# Patient Record
Sex: Female | Born: 1945 | Race: White | Hispanic: No | Marital: Single | Smoking: Former smoker
Health system: Southern US, Community
[De-identification: ages and names within clinical notes are randomized; demographics above are authoritative.]

## PROBLEM LIST (undated history)

## (undated) DIAGNOSIS — R112 Nausea with vomiting, unspecified: Secondary | ICD-10-CM

## (undated) DIAGNOSIS — Z9889 Other specified postprocedural states: Secondary | ICD-10-CM

## (undated) DIAGNOSIS — J449 Chronic obstructive pulmonary disease, unspecified: Secondary | ICD-10-CM

## (undated) HISTORY — PX: CHOLECYSTECTOMY: SHX55

## (undated) HISTORY — PX: OTHER SURGICAL HISTORY: SHX169

---

## 2012-05-03 ENCOUNTER — Encounter (HOSPITAL_COMMUNITY): Payer: Self-pay | Admitting: Emergency Medicine

## 2012-05-03 ENCOUNTER — Emergency Department (HOSPITAL_COMMUNITY)
Admission: EM | Admit: 2012-05-03 | Discharge: 2012-05-03 | Disposition: A | Discharge status: Home / Self Care | Payer: Medicare Other | Attending: Internal Medicine | Admitting: Internal Medicine

## 2012-05-03 ENCOUNTER — Emergency Department (HOSPITAL_COMMUNITY): Payer: Medicare Other

## 2012-05-03 DIAGNOSIS — L02519 Cutaneous abscess of unspecified hand: Secondary | ICD-10-CM | POA: Diagnosis not present

## 2012-05-03 DIAGNOSIS — J441 Chronic obstructive pulmonary disease with (acute) exacerbation: Secondary | ICD-10-CM | POA: Diagnosis not present

## 2012-05-03 DIAGNOSIS — L03119 Cellulitis of unspecified part of limb: Secondary | ICD-10-CM | POA: Diagnosis not present

## 2012-05-03 DIAGNOSIS — L03113 Cellulitis of right upper limb: Secondary | ICD-10-CM

## 2012-05-03 DIAGNOSIS — L089 Local infection of the skin and subcutaneous tissue, unspecified: Secondary | ICD-10-CM

## 2012-05-03 DIAGNOSIS — M7989 Other specified soft tissue disorders: Secondary | ICD-10-CM | POA: Diagnosis not present

## 2012-05-03 DIAGNOSIS — J45909 Unspecified asthma, uncomplicated: Secondary | ICD-10-CM

## 2012-05-03 LAB — CBC
HCT: 38.7 % (ref 36.0–46.0)
Hemoglobin: 13 g/dL (ref 12.0–15.0)
MCH: 30 pg (ref 26.0–34.0)
MCV: 89.2 fL (ref 78.0–100.0)
RBC: 4.34 MIL/uL (ref 3.87–5.11)

## 2012-05-03 LAB — BASIC METABOLIC PANEL
BUN: 11 mg/dL (ref 6–23)
CO2: 25 mEq/L (ref 19–32)
Chloride: 102 mEq/L (ref 96–112)
Glucose, Bld: 89 mg/dL (ref 70–99)
Potassium: 3.7 mEq/L (ref 3.5–5.1)
Sodium: 136 mEq/L (ref 135–145)

## 2012-05-03 MED ORDER — PREDNISONE 20 MG PO TABS
60.0000 mg | ORAL_TABLET | Freq: Once | ORAL | Status: AC
  Administered 2012-05-03: 60 mg via ORAL
  Filled 2012-05-03: qty 3

## 2012-05-03 MED ORDER — ALBUTEROL SULFATE HFA 108 (90 BASE) MCG/ACT IN AERS
2.0000 | INHALATION_SPRAY | RESPIRATORY_TRACT | Status: DC | PRN
  Administered 2012-05-03: 2 via RESPIRATORY_TRACT
  Filled 2012-05-03: qty 6.7

## 2012-05-03 MED ORDER — SODIUM CHLORIDE 0.9 % IN NEBU
INHALATION_SOLUTION | RESPIRATORY_TRACT | Status: AC
  Administered 2012-05-03: 15:00:00
  Filled 2012-05-03: qty 3

## 2012-05-03 MED ORDER — ALBUTEROL SULFATE (5 MG/ML) 0.5% IN NEBU
5.0000 mg | INHALATION_SOLUTION | Freq: Once | RESPIRATORY_TRACT | Status: AC
  Administered 2012-05-03: 5 mg via RESPIRATORY_TRACT
  Filled 2012-05-03: qty 1

## 2012-05-03 MED ORDER — IPRATROPIUM BROMIDE 0.02 % IN SOLN
0.5000 mg | Freq: Once | RESPIRATORY_TRACT | Status: AC
  Administered 2012-05-03: 0.5 mg via RESPIRATORY_TRACT
  Filled 2012-05-03: qty 2.5

## 2012-05-03 MED ORDER — MOXIFLOXACIN HCL IN NACL 400 MG/250ML IV SOLN
400.0000 mg | Freq: Once | INTRAVENOUS | Status: AC
  Administered 2012-05-03: 400 mg via INTRAVENOUS
  Filled 2012-05-03: qty 250

## 2012-05-03 MED ORDER — VANCOMYCIN HCL IN DEXTROSE 1-5 GM/200ML-% IV SOLN
1000.0000 mg | Freq: Once | INTRAVENOUS | Status: AC
  Administered 2012-05-03: 1000 mg via INTRAVENOUS
  Filled 2012-05-03: qty 200

## 2012-05-03 NOTE — ED Provider Notes (Signed)
History   This chart was scribed for Laray Anger, DO by Clarita Crane. The patient was seen in room APA12/APA12. Patient's care was started at 1422.    CSN: [id number]  Arrival date & time 05/03/12  1422   First MD Initiated Contact with Patient 05/03/12 1446      Chief Complaint  Patient presents with  . Shortness of Breath     HPI Pt was seen at 2:53 PM Tamara Michael is a 66 y.o. female seen at 2:53 PM who presents to the Emergency Department complaining of gradual onset and worsening of persistent SOB for the past 2 days.  Has been associated with cough.  Describes her symptoms as similar to her previous exacerbations of asthma.  Has recently run out of her inhaler and does not have a PMD for a refill.  Pt also c/o gradual onset and worsening of persistent redness and swelling to her right dorsal hand that began after she abraded her hand several days ago.  Denies drainage from wound, no fevers, no CP/palpitations, no abd pain, no N/V/D.   Last Tetanus approx 2 years ago Past Medical History  Diagnosis Date  . Asthma     Past Surgical History  Procedure Date  . Cholecystectomy   . Arm surgery     History  Substance Use Topics  . Smoking status: Former Games developer  . Smokeless tobacco: Not on file  . Alcohol Use: No    Review of Systems ROS: Statement: All systems negative except as marked or noted in the HPI; Constitutional: Negative for fever and chills. ; ; Eyes: Negative for eye pain, redness and discharge. ; ; ENMT: Negative for ear pain, hoarseness, nasal congestion, sinus pressure and sore throat. ; ; Cardiovascular: Negative for chest pain, palpitations, diaphoresis, and peripheral edema. ; ; Respiratory: +cough, SOB. Negative for wheezing and stridor. ; ; Gastrointestinal: Negative for nausea, vomiting, diarrhea, abdominal pain, blood in stool, hematemesis, jaundice and rectal bleeding. . ; ; Genitourinary: Negative for dysuria, flank pain and hematuria. ; ;  Musculoskeletal: Negative for back pain and neck pain. Negative for swelling and trauma.; ; Skin: +abrasion, rash, swelling. Negative for pruritus, blisters, bruising and skin lesion.; ; Neuro: Negative for headache, lightheadedness and neck stiffness. Negative for weakness, altered level of consciousness , altered mental status, extremity weakness, paresthesias, involuntary movement, seizure and syncope.     Allergies  Other  Home Medications  No current outpatient prescriptions on file.  BP 123/75  Pulse 114  Temp(Src) 97.5 F (36.4 C) (Oral)  Resp 24  Ht 5\' 3"  (1.6 m)  Wt 110 lb (49.896 kg)  BMI 19.49 kg/m2  SpO2 98%  Physical Exam Exam performed at 2:55PM: Physical examination:  Nursing notes reviewed; Vital signs and O2 SAT reviewed;  Constitutional: Well developed, Well nourished, Well hydrated, In no acute distress; Head:  Normocephalic, atraumatic; Eyes: EOMI, PERRL, No scleral icterus; ENMT: Mouth and pharynx normal, Mucous membranes moist; Neck: Supple, Full range of motion, No lymphadenopathy; Cardiovascular: Regular rate and rhythm, No gallop; Respiratory: Breath sounds diminished & equal bilaterally, speaking in long phrases, tachypneic, access mm use,; Chest: Nontender, Movement normal; Abdomen: Soft, Nontender, Nondistended, Normal bowel sounds; Extremities: Pulses normal, No deformity, No calf edema or asymmetry.; Neuro: AA&Ox3, Major CN grossly intact.  No gross focal motor or sensory deficits in extremities.; Skin: Color normal, Warm, Dry; +approx 1cm flap skin tear left palmar thenar eminence without drainage, erythema, or ecchymosis; Left and right wrists, hands and fingers  NT to palp, no deformities; +right dorsal hand at 3rd to 5th MCP and distal metacarpal areas with approx 4x5cm area of tenderness, erythema, induration, edema with <1cm abrasion with scab at 4th MCP area, small amount of serous drainage expressed.      ED Course  Procedures   MDM  MDM Reviewed:  nursing note and vitals Interpretation: labs and x-ray     Results for orders placed during the hospital encounter of 05/03/12  BASIC METABOLIC PANEL      Component Value Range   Sodium 136  135 - 145 (mEq/L)   Potassium 3.7  3.5 - 5.1 (mEq/L)   Chloride 102  96 - 112 (mEq/L)   CO2 25  19 - 32 (mEq/L)   Glucose, Bld 89  70 - 99 (mg/dL)   BUN 11  6 - 23 (mg/dL)   Creatinine, Ser 3.08  0.50 - 1.10 (mg/dL)   Calcium 9.6  8.4 - 65.7 (mg/dL)   GFR calc non Af Amer 73 (*) >90 (mL/min)   GFR calc Af Amer 85 (*) >90 (mL/min)  CBC      Component Value Range   WBC 4.7  4.0 - 10.5 (K/uL)   RBC 4.34  3.87 - 5.11 (MIL/uL)   Hemoglobin 13.0  12.0 - 15.0 (g/dL)   HCT 84.6  96.2 - 95.2 (%)   MCV 89.2  78.0 - 100.0 (fL)   MCH 30.0  26.0 - 34.0 (pg)   MCHC 33.6  30.0 - 36.0 (g/dL)   RDW 84.1  32.4 - 40.1 (%)   Platelets 263  150 - 400 (K/uL)   Dg Chest 2 View 05/03/2012  *RADIOLOGY REPORT*  Clinical Data: Cough.  History of smoking.  CHEST - 2 VIEW  Comparison: No priors.  Findings: Lungs appear hyperexpanded with flattening of the hemidiaphragms, increased retrosternal air space and pruning of the pulmonary vasculature in the periphery, suggestive of underlying COPD.  No consolidative airspace disease.  No pleural effusions. No evidence of edema.  Cardiomediastinal silhouette is within normal limits.  Atherosclerotic calcification within the arch of the aorta.  Lateral projection demonstrates a compression fracture at the thoracolumbar junction (likely L1) with approximately 30% loss of anterior vertebral body height.  IMPRESSION: 1.  No radiographic evidence of acute cardiopulmonary disease. 2.  Changes compatible with COPD, as above. 3.  Atherosclerosis. 4.  Compression fracture near the thoracolumbar junction (likely L1) with approximately 30% loss of anterior vertebral body height.  Original Report Authenticated By: Florencia Reasons, M.D.   Dg Hand Complete Left 05/03/2012  *RADIOLOGY REPORT*   Clinical Data: Pain at index finger.  LEFT HAND - COMPLETE 3+ VIEW  Comparison: None.  Findings: No evidence for fracture.  There is no subluxation or dislocation.  Degenerative changes are seen at the DIP joint of the index finger.  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  IMPRESSION: Degenerative changes as described.  No acute bony findings.  Original Report Authenticated By: ERIC A. MANSELL, M.D.   Dg Hand Complete Right 05/03/2012  *RADIOLOGY REPORT*  Clinical Data: Pain and swelling.  RIGHT HAND - COMPLETE 3+ VIEW  Comparison: No priors.  Findings: Three views of the right hand demonstrate no acute fracture, subluxation or dislocation.  There are advanced degenerative changes of osteoarthritis in the first [facility] joint.  Old post-traumatic deformity from remote distal radial fracture is incidentally noted.  IMPRESSION: 1.  No acute radiographic abnormality of the right hand. 2.  Old  post-traumatic deformity of the distal right radius. 3.  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  Original Report Authenticated By: Florencia Reasons, M.D.      Cali.Holter:  IV vancomycin started for right hand cellulitis. Does not appear to be abscess at this time. No pneumonia on CXR.  No fx on hand XR.  Neb and prednisone given, lungs continue diminished bilat, no wheezes, Sats now 96% R/A; pt is still sitting upright, tachypneic with dropping SBP to 90's.  Refuses another neb at this time.  Pt has no PMD to assure f/u.  Dx testing d/w pt and family.  Questions answered.  Verb understanding, agreeable to observation admit for nebs for COPD exacerbation and IV abx for hand cellulitis.  T/C to [facility] Dr. Sherrie Mustache, case discussed, including:  HPI, pertinent PM/SHx, VS/PE, dx testing, ED course and treatment:  Agreeable to observation admit, requests to write temporary orders, obtain tele bed to team 1 and start IV avelox to cover for COPD  exacerbation.       I personally performed the services described in this documentation, which was scribed in my presence. The recorded information has been reviewed and considered. Laray Corbit,Zamyra TOMECA HELM, DO 05/05/12 1656

## 2012-05-03 NOTE — Discharge Instructions (Signed)
 RESOURCE GUIDE  Dental Problems  Patients with Medicaid: [facility]                     [facility] [address]                                           [address] Phone:  [phone]                                                  Phone:  [phone]  If unable to pay or uninsured, contact:  [facility] or [facility] to become qualified for the adult dental clinic.  Chronic Pain Problems Contact [facility]  [phone] Patients need to be referred by their primary care doctor.  Insufficient Money for Medicine Contact [employer]:  call "211" or [facility] [phone].  No Primary Care Doctor Call [facility]  [phone] Other agencies that provide inexpensive medical care    [facility]  [phone]     Internal Medicine  [phone]    [facility]  [phone]    Women's Clinic  [phone]    [facility]  [phone]    [facility]  [phone]  Psychological Services [facility]  [phone] [facility]  [phone] [facility]   [phone] (emergency services [phone])  Substance Abuse Resources [facility]  336-882-2125 [facility] 336-784-9470 [facility] 336-285-9073 [facility] 336-845-3988 Residential & Outpatient Substance Abuse Program  800-659-3381  Abuse/Neglect [place] Child Abuse Hotline (336) 641-3795 [place] Child Abuse Hotline 800-378-5315 (After Hours)  Emergency Shelter [facility] (336) 271-5985  M[facility] (336) 275-9566 [facility] (704) [phone]  MRSA Hotline #:   832-7006    [place] [facility]  [facility] [place]     [employer]                          [facility] [address] [place]                       [address]      [address]  [place]                                                [place]                            [place] Phone:  [phone]                                   Phone:  [phone]                 Phone:  [phone]  [facility] Phone:    [phone]  [place] Child Abuse Hotline (336) 342-1394 (336) 342-3537 (After Hours)   

## 2012-05-03 NOTE — ED Notes (Signed)
Pt is sob x 45 minutes and is out of her asthma meds. Pt has abrasion to the rt hand.

## 2012-05-03 NOTE — ED Notes (Signed)
Pt says doesn't want an IV if she can help it.  Notified Dr. Clarene Duke.

## 2012-05-03 NOTE — Consult Note (Addendum)
Emergency room consult  Date of consult 05/03/2012   PCP:   No primary provider on file.   Physician requesting consult: Samuel Jester D.O.  Consulting physician: Vania Rea M.D.   Reason for consult: Cellulitis of the right hand, with acute exacerbation of asthma requiring steroids    Impression Acute exacerbation of asthma much improved Nontender , 2 x 2 cm Superficial cellulitis dorsum of hand, over right fourth metacarpal Absence of fever, absence of leukocytosis  No indication for inpatient management   Recommendations: Discharge home with oral antibiotics for cellulitis Discharge home with bronchodilators Reinforce the importance of establishing primary care and offer assistance as needed     HPI: Tamara Michael is an 66 y.o. female.   He is a middle-aged Caucasian lady with a history of asthma who apparently relocated to this area about 2 years ago and has not yet established primary care.  Patient presented to the emergency room complaining of shortness of breath for the past 45 minutes, and the knot out of her bronchodilators. She also complained of a abrasion of the right hand. He should receive serial nebulizations in the emergency room, and oral steroids. Her wheezing apparently did not resolve completely prior to the hospitalist being called. He also received a dose of vancomycin for cellulitis.  She gives no history of fever or chills; she has no productive cough; she is anxious to be discharged home.  Emergency room physician was concerned that giving steroids may aggravate her cellulitis and call the hospitalist service to assist with management.  Rewiew of Systems:  The patient denies anorexia, fever, weight loss,, vision loss, decreased hearing, hoarseness, chest pain, syncope, dyspnea on exertion, peripheral edema, balance deficits, hemoptysis, abdominal pain, melena, hematochezia, severe indigestion/heartburn, hematuria, incontinence,  genital sores, muscle weakness, suspicious skin lesions, transient blindness, difficulty walking, depression, unusual weight change, abnormal bleeding, enlarged lymph nodes, angioedema, and breast masses.   Past Medical History  Diagnosis Date  . Asthma     Past Surgical History  Procedure Date  . Cholecystectomy   . Arm surgery     Medications:  HOME MEDS: Prior to Admission medications   Medication Sig Start Date End Date Taking? Authorizing Provider  albuterol (PROVENTIL HFA;VENTOLIN HFA) 108 (90 BASE) MCG/ACT inhaler Inhale 2 puffs into the lungs every 6 (six) hours as needed.   Yes Historical Provider, MD     Allergies:  Allergies  Allergen Reactions  . Other     anesthesia    Social History:   reports that she has quit smoking. She does not have any smokeless tobacco history on file. She reports that she does not drink alcohol or use illicit drugs.  Family History: History reviewed. No pertinent family history.   Physical Exam: Filed Vitals:   05/03/12 1425 05/03/12 1455  BP: 123/75   Pulse: 114   Temp: 97.5 F (36.4 C)   TempSrc: Oral   Resp: 24   Height: 5\' 3"  (1.6 m)   Weight: 49.896 kg (110 lb)   SpO2: 98% 99%   Blood pressure 123/75, pulse 114, temperature 97.5 F (36.4 C), temperature source Oral, resp. rate 24, height 5\' 3"  (1.6 m), weight 49.896 kg (110 lb), SpO2 99.00%.  GEN:  Pleasant middle-aged lady sitting up in the stretcher in no acute distress; cooperative with exam PSYCH:  alert and oriented x4; does not appear anxious or depressed; affect is appropriate. HEENT: Mucous membranes pink and anicteric; PERRLA; EOM intact; no cervical lymphadenopathy nor thyromegaly or carotid  bruit; no JVD; Breasts:: Not examined CHEST WALL: No tenderness CHEST: Normal respiration, clear to auscultation bilaterally; no wheezing, no rhonchi HEART: Regular rate and rhythm; no murmurs rubs or gallops BACK: Mild kyphosis no scoliosis; no CVA  tenderness ABDOMEN: Scaphoid, soft non-tender; no masses, no organomegaly, normal abdominal bowel sounds; no pannus; no intertriginous candida. Rectal Exam: Not done EXTREMITIES:  age-appropriate arthropathy of the hands and knees; no edema; reddened area 2 x 2 cm, dorsum of right over the fourth metacarpal; nontender, branches easily to touch, is a central area of scabbing suggesting a healing abrasion.Renetta Chalk: not examined PULSES: 2+ and symmetric SKIN: Normal hydration no rash or ulceration other than noted above CNS: Cranial nerves 2-12 grossly intact no focal lateralizing neurologic deficit   Labs & Imaging Results for orders placed during the hospital encounter of 05/03/12 (from the past 48 hour(s))  BASIC METABOLIC PANEL     Status: Abnormal   Collection Time   05/03/12  3:02 PM      Component Value Range Comment   Sodium 136  135 - 145 (mEq/L)    Potassium 3.7  3.5 - 5.1 (mEq/L)    Chloride 102  96 - 112 (mEq/L)    CO2 25  19 - 32 (mEq/L)    Glucose, Bld 89  70 - 99 (mg/dL)    BUN 11  6 - 23 (mg/dL)    Creatinine, Ser 9.60  0.50 - 1.10 (mg/dL)    Calcium 9.6  8.4 - 10.5 (mg/dL)    GFR calc non Af Amer 73 (*) >90 (mL/min)    GFR calc Af Amer 85 (*) >90 (mL/min)   CBC     Status: Normal   Collection Time   05/03/12  3:02 PM      Component Value Range Comment   WBC 4.7  4.0 - 10.5 (K/uL)    RBC 4.34  3.87 - 5.11 (MIL/uL)    Hemoglobin 13.0  12.0 - 15.0 (g/dL)    HCT 45.4  09.8 - 11.9 (%)    MCV 89.2  78.0 - 100.0 (fL)    MCH 30.0  26.0 - 34.0 (pg)    MCHC 33.6  30.0 - 36.0 (g/dL)    RDW 14.7  82.9 - 56.2 (%)    Platelets 263  150 - 400 (K/uL)    Dg Chest 2 View  05/03/2012  *RADIOLOGY REPORT*  Clinical Data: Cough.  History of smoking.  CHEST - 2 VIEW  Comparison: No priors.  Findings: Lungs appear hyperexpanded with flattening of the hemidiaphragms, increased retrosternal air space and pruning of the pulmonary vasculature in the periphery, suggestive of underlying  COPD.  No consolidative airspace disease.  No pleural effusions. No evidence of edema.  Cardiomediastinal silhouette is within normal limits.  Atherosclerotic calcification within the arch of the aorta.  Lateral projection demonstrates a compression fracture at the thoracolumbar junction (likely L1) with approximately 30% loss of anterior vertebral body height.  IMPRESSION: 1.  No radiographic evidence of acute cardiopulmonary disease. 2.  Changes compatible with COPD, as above. 3.  Atherosclerosis. 4.  Compression fracture near the thoracolumbar junction (likely L1) with approximately 30% loss of anterior vertebral body height.  Original Report Authenticated By: Florencia Reasons, M.D.   Dg Hand Complete Left  05/03/2012  *RADIOLOGY REPORT*  Clinical Data: Pain at index finger.  LEFT HAND - COMPLETE 3+ VIEW  Comparison: None.  Findings: No evidence for fracture.  There is no subluxation or dislocation.  Degenerative  changes are seen at the DIP joint of the index finger.  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  IMPRESSION: Degenerative changes as described.  No acute bony findings.  Original Report Authenticated By: ERIC A. MANSELL, M.D.   Dg Hand Complete Right  05/03/2012  *RADIOLOGY REPORT*  Clinical Data: Pain and swelling.  RIGHT HAND - COMPLETE 3+ VIEW  Comparison: No priors.  Findings: Three views of the right hand demonstrate no acute fracture, subluxation or dislocation.  There are advanced degenerative changes of osteoarthritis in the first [facility] joint.  Old post-traumatic deformity from remote distal radial fracture is incidentally noted.  IMPRESSION: 1.  No acute radiographic abnormality of the right hand. 2.  Old post-traumatic deformity of the distal right radius. 3.  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  Original Report Authenticated By: Florencia Reasons, M.D.     Time spent interviewing and examining this patient,  this patient discussing and planning her care with staff : 30 minutes  Leaira Fullam 05/03/2012, 7:30 PM    Addendum  Emergency room physician felt uncomfortable discharging home the patient, and so I wrote the patient prescriptions for for 5 days of amoxicillin 500 mg times daily, which I feel is more than adequate for this superficial the healing infection, And a prescription for an albuterol inhaler. I did not prescribe steroids since patient is now completely free of any respiratory symptoms and I feel she should demonstrate more assistance to standard therapy before subjecting her to steroids.   Vania Rea MD 05/03/2012  8:34 PM

## 2012-05-03 NOTE — ED Notes (Signed)
Prescription for Albuterol inhaler 2 puffs every 4 hours as needed and Amoxil 500 mg three times a day for 5 days written by Dr. Oralia Manis.

## 2012-05-15 ENCOUNTER — Encounter (HOSPITAL_COMMUNITY): Payer: Self-pay | Admitting: Emergency Medicine

## 2012-05-15 ENCOUNTER — Emergency Department (HOSPITAL_COMMUNITY)
Admission: EM | Admit: 2012-05-15 | Discharge: 2012-05-15 | Disposition: A | Discharge status: Home / Self Care | Payer: Medicare Other | Attending: Emergency Medicine | Admitting: Emergency Medicine

## 2012-05-15 DIAGNOSIS — R0602 Shortness of breath: Secondary | ICD-10-CM | POA: Insufficient documentation

## 2012-05-15 DIAGNOSIS — J4489 Other specified chronic obstructive pulmonary disease: Secondary | ICD-10-CM | POA: Insufficient documentation

## 2012-05-15 DIAGNOSIS — J449 Chronic obstructive pulmonary disease, unspecified: Secondary | ICD-10-CM | POA: Diagnosis not present

## 2012-05-15 DIAGNOSIS — J45909 Unspecified asthma, uncomplicated: Secondary | ICD-10-CM | POA: Diagnosis not present

## 2012-05-15 DIAGNOSIS — R059 Cough, unspecified: Secondary | ICD-10-CM | POA: Diagnosis not present

## 2012-05-15 DIAGNOSIS — R05 Cough: Secondary | ICD-10-CM | POA: Insufficient documentation

## 2012-05-15 HISTORY — DX: Chronic obstructive pulmonary disease, unspecified: J44.9

## 2012-05-15 MED ORDER — ALBUTEROL SULFATE (5 MG/ML) 0.5% IN NEBU
5.0000 mg | INHALATION_SOLUTION | Freq: Once | RESPIRATORY_TRACT | Status: AC
  Administered 2012-05-15: 5 mg via RESPIRATORY_TRACT
  Filled 2012-05-15: qty 1

## 2012-05-15 MED ORDER — ALBUTEROL SULFATE HFA 108 (90 BASE) MCG/ACT IN AERS
2.0000 | INHALATION_SPRAY | RESPIRATORY_TRACT | Status: DC | PRN
  Filled 2012-05-15: qty 6.7

## 2012-05-15 MED ORDER — IPRATROPIUM BROMIDE 0.02 % IN SOLN
0.5000 mg | Freq: Once | RESPIRATORY_TRACT | Status: AC
  Administered 2012-05-15: 0.5 mg via RESPIRATORY_TRACT
  Filled 2012-05-15: qty 2.5

## 2012-05-15 MED ORDER — PREDNISONE 20 MG PO TABS
60.0000 mg | ORAL_TABLET | Freq: Once | ORAL | Status: AC
  Administered 2012-05-15: 60 mg via ORAL
  Filled 2012-05-15: qty 3

## 2012-05-15 MED ORDER — ALBUTEROL SULFATE HFA 108 (90 BASE) MCG/ACT IN AERS: 1.0000 | INHALATION_SPRAY | Freq: Four times a day (QID) | RESPIRATORY_TRACT | Status: DC | PRN

## 2012-05-15 MED ORDER — PREDNISONE 50 MG PO TABS: ORAL_TABLET | ORAL | Status: AC

## 2012-05-15 NOTE — ED Provider Notes (Signed)
History     CSN: [id number]  Arrival date & time 05/15/12  0012   First MD Initiated Contact with Patient 05/15/12 0136      Chief Complaint  Patient presents with  . Shortness of Breath     Patient is a 66 y.o. female presenting with shortness of breath. The history is provided by the patient.  Shortness of Breath  The current episode started today. The onset was gradual. The problem occurs frequently. The problem has been gradually worsening. The problem is moderate. The symptoms are relieved by beta-agonist inhalers. The symptoms are aggravated by activity. Associated symptoms include cough, shortness of breath and wheezing. Pertinent negatives include no chest pain and no fever.  pt reports long h/o asthma She reports for past day she has had cough/wheeze She reports her inhaler is not working Denies current smoking Denies h/o intubation   Past Medical History  Diagnosis Date  . Asthma   . COPD (chronic obstructive pulmonary disease)     Past Surgical History  Procedure Date  . Cholecystectomy   . Arm surgery     No family history on file.  History  Substance Use Topics  . Smoking status: Former Games developer  . Smokeless tobacco: Not on file  . Alcohol Use: No    OB History    Grav Para Term Preterm Abortions TAB SAB Ect Mult Living                  Review of Systems  Constitutional: Negative for fever.  Respiratory: Positive for cough, shortness of breath and wheezing.   Cardiovascular: Negative for chest pain.    Allergies  Other  Home Medications   Current Outpatient Rx  Name Route Sig Dispense Refill  . ALBUTEROL SULFATE HFA 108 (90 BASE) MCG/ACT IN AERS Inhalation Inhale 2 puffs into the lungs every 6 (six) hours as needed.    . ALBUTEROL SULFATE HFA 108 (90 BASE) MCG/ACT IN AERS Inhalation Inhale 1-2 puffs into the lungs every 6 (six) hours as needed for wheezing. 1 Inhaler 0  . PREDNISONE 50 MG PO TABS  One tablet PO daily 4 tablet 0    BP  113/66  Pulse 72  Temp(Src) 97.8 F (36.6 C) (Oral)  Resp 24  Ht 5\' 4"  (1.626 m)  Wt 110 lb (49.896 kg)  BMI 18.88 kg/m2  SpO2 100%  Physical Exam CONSTITUTIONAL: Well developed/well nourished HEAD AND FACE: Normocephalic/atraumatic EYES: EOMI/PERRL ENMT: Mucous membranes moist NECK: supple no meningeal signs CV: S1/S2 noted, no murmurs/rubs/gallops noted LUNGS: scattered wheezes noted bilaterally, but no apparent distress and able to speak to me clearly ABDOMEN: soft, nontender, no rebound or guarding GU:no cva tenderness NEURO: Pt is awake/alert, moves all extremitiesx4 EXTREMITIES: pulses normal, full ROM, no edema noted SKIN: warm, color normal PSYCH: no abnormalities of mood noted  ED Course  Procedures    1. Asthma    Pt improved by the time of my evaluation, she had received nebs, felt improved, wheeze improved and walked around ED in no distress Stable for d/c Imaging not ordered as afebrile here, no hypoxia, doubt CHF   The patient appears reasonably screened and/or stabilized for discharge and I doubt any other medical condition or other [facility] requiring further screening, evaluation, or treatment in the ED at this time prior to discharge.    MDM  Nursing notes reviewed and considered in documentation Previous records reviewed and considered Previous Xray reviewed       Date: 05/15/2012  Rate: 55  Rhythm: sinus bradycardia  QRS Axis: normal  Intervals: normal  ST/T Wave abnormalities: normal  Conduction Disutrbances:none    Joya Gaskins, MD 05/15/12 [phone]

## 2012-05-15 NOTE — ED Notes (Signed)
Patient c/o shortness of breath that started today.

## 2012-05-15 NOTE — ED Notes (Signed)
Discharge instructions given and reviewed with patient.  Prescriptions reviewed for Albuterol MDI and Prednisone; effects and use explained.  Patient verbalized understanding of medications and to follow up with PMD as needed.  Patient ambulatory; discharged home in good condition.

## 2012-05-15 NOTE — ED Notes (Signed)
MD at bedside. 

## 2012-05-15 NOTE — Discharge Instructions (Signed)
Asthma Attack Prevention HOW CAN ASTHMA BE PREVENTED? Currently, there is no way to prevent asthma from starting. However, you can take steps to control the disease and prevent its symptoms after you have been diagnosed. Learn about your asthma and how to control it. Take an active role to control your asthma by working with your caregiver to create and follow an asthma action plan. An asthma action plan guides you in taking your medicines properly, avoiding factors that make your asthma worse, tracking your level of asthma control, responding to worsening asthma, and seeking emergency care when needed. To track your asthma, keep records of your symptoms, check your peak flow number using a peak flow meter (handheld device that shows how well air moves out of your lungs), and get regular asthma checkups.  Other ways to prevent asthma attacks include:  Use medicines as your caregiver directs.   Identify and avoid things that make your asthma worse (as much as you can).   Keep track of your asthma symptoms and level of control.   Get regular checkups for your asthma.   With your caregiver, write a detailed plan for taking medicines and managing an asthma attack. Then be sure to follow your action plan. Asthma is an ongoing condition that needs regular monitoring and treatment.   Identify and avoid asthma triggers. A number of outdoor allergens and irritants (pollen, mold, cold air, air pollution) can trigger asthma attacks. Find out what causes or makes your asthma worse, and take steps to avoid those triggers (see below).   Monitor your breathing. Learn to recognize warning signs of an attack, such as slight coughing, wheezing or shortness of breath. However, your lung function may already decrease before you notice any signs or symptoms, so regularly measure and record your peak airflow with a home peak flow meter.   Identify and treat attacks early. If you act quickly, you're less likely to have  a severe attack. You will also need less medicine to control your symptoms. When your peak flow measurements decrease and alert you to an upcoming attack, take your medicine as instructed, and immediately stop any activity that may have triggered the attack. If your symptoms do not improve, get medical help.   Pay attention to increasing quick-relief inhaler use. If you find yourself relying on your quick-relief inhaler (such as albuterol), your asthma is not under control. See your caregiver about adjusting your treatment.  IDENTIFY AND CONTROL FACTORS THAT MAKE YOUR ASTHMA WORSE A number of common things can set off or make your asthma symptoms worse (asthma triggers). Keep track of your asthma symptoms for several weeks, detailing all the environmental and emotional factors that are linked with your asthma. When you have an asthma attack, go back to your asthma diary to see which factor, or combination of factors, might have contributed to it. Once you know what these factors are, you can take steps to control many of them.  Allergies: If you have allergies and asthma, it is important to take asthma prevention steps at home. Asthma attacks (worsening of asthma symptoms) can be triggered by allergies, which can cause temporary increased inflammation of your airways. Minimizing contact with the substance to which you are allergic will help prevent an asthma attack. Animal Dander:   Some people are allergic to the flakes of skin or dried saliva from animals with fur or feathers. Keep these pets out of your home.   If you can't keep a pet outdoors, keep the   pet out of your bedroom and other sleeping areas at all times, and keep the door closed.   Remove carpets and furniture covered with cloth from your home. If that is not possible, keep the pet away from fabric-covered furniture and carpets.  Dust Mites:  Many people with asthma are allergic to dust mites. Dust mites are tiny bugs that are found in  every home, in mattresses, pillows, carpets, fabric-covered furniture, bedcovers, clothes, stuffed toys, fabric, and other fabric-covered items.   Cover your mattress in a special dust-proof cover.   Cover your pillow in a special dust-proof cover, or wash the pillow each week in hot water. Water must be hotter than 130 F to kill dust mites. Cold or warm water used with detergent and bleach can also be effective.   Wash the sheets and blankets on your bed each week in hot water.   Try not to sleep or lie on cloth-covered cushions.   Call ahead when traveling and ask for a smoke-free hotel room. Bring your own bedding and pillows, in case the hotel only supplies feather pillows and down comforters, which may contain dust mites and cause asthma symptoms.   Remove carpets from your bedroom and those laid on concrete, if you can.   Keep stuffed toys out of the bed, or wash the toys weekly in hot water or cooler water with detergent and bleach.  Cockroaches:  Many people with asthma are allergic to the droppings and remains of cockroaches.   Keep food and garbage in closed containers. Never leave food out.   Use poison baits, traps, powders, gels, or paste (for example, boric acid).   If a spray is used to kill cockroaches, stay out of the room until the odor goes away.  Indoor Mold:  Fix leaky faucets, pipes, or other sources of water that have mold around them.   Clean moldy surfaces with a cleaner that has bleach in it.  Pollen and Outdoor Mold:  When pollen or mold spore counts are high, try to keep your windows closed.   Stay indoors with windows closed from late morning to afternoon, if you can. Pollen and some mold spore counts are highest at that time.   Ask your caregiver whether you need to take or increase anti-inflammatory medicine before your allergy season starts.  Irritants:   Tobacco smoke is an irritant. If you smoke, ask your caregiver how you can quit. Ask family  members to quit smoking, too. Do not allow smoking in your home or car.   If possible, do not use a wood-burning stove, kerosene heater, or fireplace. Minimize exposure to all sources of smoke, including incense, candles, fires, and fireworks.   Try to stay away from strong odors and sprays, such as perfume, talcum powder, hair spray, and paints.   Decrease humidity in your home and use an indoor air cleaning device. Reduce indoor humidity to below 60 percent. Dehumidifiers or central air conditioners can do this.   Try to have someone else vacuum for you once or twice a week, if you can. Stay out of rooms while they are being vacuumed and for a short while afterward.   If you vacuum, use a dust mask from a hardware store, a double-layered or microfilter vacuum cleaner bag, or a vacuum cleaner with a HEPA filter.   Sulfites in foods and beverages can be irritants. Do not drink beer or wine, or eat dried fruit, processed potatoes, or shrimp if they cause asthma   symptoms.   Cold air can trigger an asthma attack. Cover your nose and mouth with a scarf on cold or windy days.   Several health conditions can make asthma more difficult to manage, including runny nose, sinus infections, reflux disease, psychological stress, and sleep apnea. Your caregiver will treat these conditions, as well.   Avoid close contact with people who have a cold or the flu, since your asthma symptoms may get worse if you catch the infection from them. Wash your hands thoroughly after touching items that may have been handled by people with a respiratory infection.   Get a flu shot every year to protect against the flu virus, which often makes asthma worse for days or weeks. Also get a pneumonia shot once every five to 10 years.  Drugs:  Aspirin and other painkillers can cause asthma attacks. 10% to 20% of people with asthma have sensitivity to aspirin or a group of painkillers called non-steroidal anti-inflammatory drugs  (NSAIDS), such as ibuprofen and naproxen. These drugs are used to treat pain and reduce fevers. Asthma attacks caused by any of these medicines can be severe and even fatal. These drugs must be avoided in people who have known aspirin sensitive asthma. Products with acetaminophen are considered safe for people who have asthma. It is important that people with aspirin sensitivity read labels of all over-the-counter drugs used to treat pain, colds, coughs, and fever.   Beta blockers and ACE inhibitors are other drugs which you should discuss with your caregiver, in relation to your asthma.  ALLERGY SKIN TESTING  Ask your asthma caregiver about allergy skin testing or blood testing (RAST test) to identify the allergens to which you are sensitive. If you are found to have allergies, allergy shots (immunotherapy) for asthma may help prevent future allergies and asthma. With allergy shots, small doses of allergens (substances to which you are allergic) are injected under your skin on a regular schedule. Over a period of time, your body may become used to the allergen and less responsive with asthma symptoms. You can also take measures to minimize your exposure to those allergens. EXERCISE  If you have exercise-induced asthma, or are planning vigorous exercise, or exercise in cold, humid, or dry environments, prevent exercise-induced asthma by following your caregiver's advice regarding asthma treatment before exercising. Document Released: 11/21/2009 Document Revised: 11/22/2011 Document Reviewed: 11/21/2009 [employer] Patient Information 2012 [employer]. 

## 2012-05-15 NOTE — ED Notes (Signed)
Pt reports SOB x1 day. Pt states she is out of her Inhaler, has a script but has been unable to pick up. Pt gets get short of breath w/ speaking & has to pause to get breath.

## 2012-05-15 NOTE — ED Notes (Signed)
Pt ambulated in hall. O2 sats remains 98% while walking. Pt has no SOB at this time. Pt states feels much better & ready to go home.

## 2012-09-11 DIAGNOSIS — Z23 Encounter for immunization: Secondary | ICD-10-CM | POA: Diagnosis not present

## 2012-11-20 ENCOUNTER — Emergency Department (HOSPITAL_COMMUNITY)
Admission: EM | Admit: 2012-11-20 | Discharge: 2012-11-20 | Disposition: A | Discharge status: Home / Self Care | Payer: Medicare Other | Attending: Emergency Medicine | Admitting: Emergency Medicine

## 2012-11-20 ENCOUNTER — Emergency Department (HOSPITAL_COMMUNITY): Payer: Medicare Other

## 2012-11-20 ENCOUNTER — Encounter (HOSPITAL_COMMUNITY): Payer: Self-pay | Admitting: Emergency Medicine

## 2012-11-20 DIAGNOSIS — R059 Cough, unspecified: Secondary | ICD-10-CM | POA: Insufficient documentation

## 2012-11-20 DIAGNOSIS — J441 Chronic obstructive pulmonary disease with (acute) exacerbation: Secondary | ICD-10-CM | POA: Insufficient documentation

## 2012-11-20 DIAGNOSIS — R05 Cough: Secondary | ICD-10-CM | POA: Diagnosis not present

## 2012-11-20 DIAGNOSIS — M25519 Pain in unspecified shoulder: Secondary | ICD-10-CM | POA: Diagnosis not present

## 2012-11-20 DIAGNOSIS — Z87891 Personal history of nicotine dependence: Secondary | ICD-10-CM | POA: Insufficient documentation

## 2012-11-20 DIAGNOSIS — Z79899 Other long term (current) drug therapy: Secondary | ICD-10-CM | POA: Insufficient documentation

## 2012-11-20 DIAGNOSIS — J449 Chronic obstructive pulmonary disease, unspecified: Secondary | ICD-10-CM | POA: Diagnosis not present

## 2012-11-20 DIAGNOSIS — J45909 Unspecified asthma, uncomplicated: Secondary | ICD-10-CM | POA: Insufficient documentation

## 2012-11-20 MED ORDER — ALBUTEROL SULFATE HFA 108 (90 BASE) MCG/ACT IN AERS
2.0000 | INHALATION_SPRAY | RESPIRATORY_TRACT | Status: DC
  Administered 2012-11-20 (×2): 2 via RESPIRATORY_TRACT
  Filled 2012-11-20 (×2): qty 6.7

## 2012-11-20 MED ORDER — PREDNISONE 50 MG PO TABS
60.0000 mg | ORAL_TABLET | Freq: Once | ORAL | Status: AC
  Administered 2012-11-20: 60 mg via ORAL
  Filled 2012-11-20: qty 1

## 2012-11-20 MED ORDER — PREDNISONE 10 MG PO TABS: 60.0000 mg | ORAL_TABLET | Freq: Every day | ORAL | Status: DC

## 2012-11-20 MED ORDER — IPRATROPIUM BROMIDE 0.02 % IN SOLN
0.5000 mg | Freq: Once | RESPIRATORY_TRACT | Status: AC
  Administered 2012-11-20: 0.5 mg via RESPIRATORY_TRACT
  Filled 2012-11-20: qty 2.5

## 2012-11-20 MED ORDER — ALBUTEROL SULFATE HFA 108 (90 BASE) MCG/ACT IN AERS: 2.0000 | INHALATION_SPRAY | RESPIRATORY_TRACT | Status: DC | PRN

## 2012-11-20 MED ORDER — ALBUTEROL SULFATE (5 MG/ML) 0.5% IN NEBU
5.0000 mg | INHALATION_SOLUTION | Freq: Once | RESPIRATORY_TRACT | Status: AC
Start: 2012-11-20 — End: 2012-11-20
  Administered 2012-11-20: 5 mg via RESPIRATORY_TRACT
  Filled 2012-11-20: qty 1

## 2012-11-20 NOTE — ED Notes (Signed)
Pt c/o sob, cough with yellow phlegm x 4 days. Pain to back of shoulder blades that is worse with deep breathing. Alert/oriented. No resp distress noted

## 2012-11-20 NOTE — ED Provider Notes (Signed)
History  This chart was scribed for Tamara Co, MD by Ardeen Jourdain, ED Scribe. This patient was seen in room APA01/APA01 and the patient's care was started at 1200.  CSN: [id number]  Arrival date & time 11/20/12  1135   First MD Initiated Contact with Patient 11/20/12 1200      Chief Complaint  Patient presents with  . Shortness of Breath     The history is provided by the patient. No language interpreter was used.    Tamara Michael is a 66 y.o. female who presents to the Emergency Department complaining of gradually worsening SOB with associated productive cough and shoulder pain. She states her sputum is yellow/green in color. She reports her symptoms started 4 days ago and have been constant. She denies fever, CP and chest tightness as associated symptoms. She states taking her albuterol inhaler with no relief, but she reports running out of her inhaler a few days ago. She reports the current symptoms are similar to previous COPD flare ups. She has a h/o asthma and COPD. Pt is a former smoker but denies alcohol use.   Past Medical History  Diagnosis Date  . Asthma   . COPD (chronic obstructive pulmonary disease)     Past Surgical History  Procedure Date  . Cholecystectomy   . Arm surgery     History reviewed. No pertinent family history.  History  Substance Use Topics  . Smoking status: Former Games developer  . Smokeless tobacco: Not on file  . Alcohol Use: No   No OB history available.   Review of Systems  All other systems reviewed and are negative.  A complete 10 system review of systems was obtained and all systems are negative except as noted in the HPI and PMH.    Allergies  Other  Home Medications   Current Outpatient Rx  Name  Route  Sig  Dispense  Refill  . ALBUTEROL SULFATE HFA 108 (90 BASE) MCG/ACT IN AERS   Inhalation   Inhale 2 puffs into the lungs every 6 (six) hours as needed.         . ALBUTEROL SULFATE HFA 108 (90 BASE) MCG/ACT IN  AERS   Inhalation   Inhale 1-2 puffs into the lungs every 6 (six) hours as needed for wheezing.   1 Inhaler   0     Triage Vitals: BP 122/81  Pulse 76  Temp 97.3 F (36.3 C) (Oral)  Resp 23  Ht 5\' 4"  (1.626 m)  Wt 110 lb (49.896 kg)  BMI 18.88 kg/m2  SpO2 96%  Physical Exam  Nursing note and vitals reviewed. Constitutional: She is oriented to person, place, and time. She appears well-developed and well-nourished. No distress.  HENT:  Head: Normocephalic and atraumatic.  Eyes: EOM are normal.  Neck: Normal range of motion.  Cardiovascular: Normal rate, regular rhythm and normal heart sounds.   Pulmonary/Chest: Effort normal and breath sounds normal. She has no wheezes.       No rhonchi, deceased breath sounds in bases    Abdominal: Soft. Bowel sounds are normal. She exhibits no distension. There is no tenderness.  Musculoskeletal: Normal range of motion.  Neurological: She is alert and oriented to person, place, and time.  Skin: Skin is warm and dry.  Psychiatric: She has a normal mood and affect. Judgment normal.    ED Course  Procedures (including critical care time)  DIAGNOSTIC STUDIES: Oxygen Saturation is 96% on room air, adequate by my interpretation.  COORDINATION OF CARE:  12:19 PM: Discussed treatment plan which includes steroids and albuterol with pt at bedside and pt agreed to plan.    Labs Reviewed - No data to display Dg Chest 2 View  11/20/2012  *RADIOLOGY REPORT*  Clinical Data: Asthma, shortness of breath  CHEST - 2 VIEW  Comparison: Chest x-ray of 05/03/2012  Findings: The lungs are markedly hyperaerated consistent with COPD. No active infiltrate or effusion is seen.  Mediastinal contours are stable.  The heart is within normal limits in size.  No acute bony abnormality is seen, with an old compression deformity noted in the lower thoracic or upper lumbar spine.  IMPRESSION: Marked COPD.  No active lung disease.   Original Report Authenticated By:  Dwyane Dee, M.D.    I personally reviewed the imaging tests through PACS system I reviewed available ER/hospitalization records through the EMR   1. COPD exacerbation       MDM  Significant improvement in her symptoms after a breathing treatment in the emergency department.  This is consistent with a COPD exacerbation.  Chest x-ray without evidence of pneumonia.  The patient feels much better.  PCP followup      I personally performed the services described in this documentation, which was scribed in my presence. The recorded information has been reviewed and is accurate.      Tamara Co, MD 11/20/12 [phone]

## 2013-01-24 ENCOUNTER — Emergency Department (HOSPITAL_COMMUNITY)
Admission: EM | Admit: 2013-01-24 | Discharge: 2013-01-24 | Disposition: A | Discharge status: Home / Self Care | Payer: Medicare Other | Attending: Emergency Medicine | Admitting: Emergency Medicine

## 2013-01-24 ENCOUNTER — Encounter (HOSPITAL_COMMUNITY): Payer: Self-pay

## 2013-01-24 DIAGNOSIS — J449 Chronic obstructive pulmonary disease, unspecified: Secondary | ICD-10-CM

## 2013-01-24 DIAGNOSIS — R0789 Other chest pain: Secondary | ICD-10-CM | POA: Diagnosis not present

## 2013-01-24 DIAGNOSIS — J45901 Unspecified asthma with (acute) exacerbation: Secondary | ICD-10-CM | POA: Insufficient documentation

## 2013-01-24 DIAGNOSIS — Z87891 Personal history of nicotine dependence: Secondary | ICD-10-CM | POA: Diagnosis not present

## 2013-01-24 DIAGNOSIS — J441 Chronic obstructive pulmonary disease with (acute) exacerbation: Secondary | ICD-10-CM | POA: Insufficient documentation

## 2013-01-24 MED ORDER — ALBUTEROL SULFATE HFA 108 (90 BASE) MCG/ACT IN AERS: 1.0000 | INHALATION_SPRAY | Freq: Four times a day (QID) | RESPIRATORY_TRACT | Status: DC | PRN

## 2013-01-24 MED ORDER — IPRATROPIUM BROMIDE 0.02 % IN SOLN
0.5000 mg | Freq: Once | RESPIRATORY_TRACT | Status: AC
  Administered 2013-01-24: 0.5 mg via RESPIRATORY_TRACT
  Filled 2013-01-24: qty 2.5

## 2013-01-24 MED ORDER — ZOLPIDEM TARTRATE 5 MG PO TABS: 5.0000 mg | ORAL_TABLET | Freq: Every evening | ORAL | Status: DC | PRN

## 2013-01-24 MED ORDER — ALBUTEROL SULFATE (5 MG/ML) 0.5% IN NEBU
2.5000 mg | INHALATION_SOLUTION | Freq: Once | RESPIRATORY_TRACT | Status: AC
  Administered 2013-01-24: 2.5 mg via RESPIRATORY_TRACT
  Filled 2013-01-24: qty 0.5

## 2013-01-24 MED ORDER — ALBUTEROL SULFATE HFA 108 (90 BASE) MCG/ACT IN AERS
2.0000 | INHALATION_SPRAY | Freq: Once | RESPIRATORY_TRACT | Status: AC
  Administered 2013-01-24: 2 via RESPIRATORY_TRACT
  Filled 2013-01-24: qty 6.7

## 2013-01-24 NOTE — ED Notes (Signed)
Pt stated she is out of her ventolin for 2-3 days, has known asthma and out of meds.

## 2013-01-24 NOTE — ED Provider Notes (Signed)
History    This chart was scribed for Benny Lennert, MD by Charolett Bumpers, ED Scribe. The patient was seen in room APA17/APA17. Patient's care was started at 1540.   CSN: [id number]  Arrival date & time 01/24/13  1434   First MD Initiated Contact with Patient 01/24/13 1540      Chief Complaint  Patient presents with  . Asthma  . Medication Refill   Tamara Michael is a 67 y.o. female who presents to the Emergency Department complaining of gradually improving moderate wheezing that started this morning with associated SOB and chest tightness. She states that she had an asthma attack this morning and is currently out of her inhaler and needs a refill. She states that she ran out of her ventolin 2-3 days ago. She states that her symptoms have since improved spontaneously since being in ED.   Patient is a 67 y.o. female presenting with asthma. The history is provided by the patient. No language interpreter was used.  Asthma This is a chronic problem. The current episode started 3 to 5 hours ago. The problem occurs constantly. The problem has been gradually improving. Associated symptoms include shortness of breath. Pertinent negatives include no chest pain, no abdominal pain and no headaches. She has tried nothing for the symptoms.      Past Medical History  Diagnosis Date  . Asthma   . COPD (chronic obstructive pulmonary disease)     Past Surgical History  Procedure Laterality Date  . Cholecystectomy    . Arm surgery      No family history on file.  History  Substance Use Topics  . Smoking status: Former Games developer  . Smokeless tobacco: Not on file  . Alcohol Use: No    OB History   Grav Para Term Preterm Abortions TAB SAB Ect Mult Living                  Review of Systems  Constitutional: Negative for fatigue.  HENT: Negative for congestion, sinus pressure and ear discharge.   Eyes: Negative for discharge.  Respiratory: Positive for chest tightness, shortness  of breath and wheezing. Negative for cough.   Cardiovascular: Negative for chest pain.  Gastrointestinal: Negative for abdominal pain and diarrhea.  Genitourinary: Negative for frequency and hematuria.  Musculoskeletal: Negative for back pain.  Skin: Negative for rash.  Neurological: Negative for seizures and headaches.  Psychiatric/Behavioral: Negative for hallucinations.  All other systems reviewed and are negative.    Allergies  Other  Home Medications   Current Outpatient Rx  Name  Route  Sig  Dispense  Refill  . acetaminophen (TYLENOL) 500 MG tablet   Oral   Take 1,000 mg by mouth every 6 (six) hours as needed. Pain.         Marland Kitchen albuterol (PROVENTIL HFA;VENTOLIN HFA) 108 (90 BASE) MCG/ACT inhaler   Inhalation   Inhale 2 puffs into the lungs every 4 (four) hours as needed for wheezing.   1 Inhaler   0     BP 109/68  Pulse 88  Temp(Src) 97.6 F (36.4 C) (Oral)  Resp 23  SpO2 100%  Physical Exam  Nursing note and vitals reviewed. Constitutional: She is oriented to person, place, and time. She appears well-developed.  HENT:  Head: Normocephalic and atraumatic.  Eyes: Conjunctivae and EOM are normal. No scleral icterus.  Neck: Neck supple. No thyromegaly present.  Cardiovascular: Normal rate and regular rhythm.  Exam reveals no gallop and no friction rub.  No murmur heard. Pulmonary/Chest: No stridor. She has wheezes. She has no rales. She exhibits no tenderness.  Minimal wheezes bilaterally.  Abdominal: She exhibits no distension. There is no tenderness. There is no rebound.  Musculoskeletal: Normal range of motion. She exhibits no edema.  Lymphadenopathy:    She has no cervical adenopathy.  Neurological: She is oriented to person, place, and time. Coordination normal.  Skin: No rash noted. No erythema.  Psychiatric: She has a normal mood and affect. Her behavior is normal.    ED Course  Procedures (including critical care time)  DIAGNOSTIC  STUDIES: Oxygen Saturation is 100% on room air, normal by my interpretation.    COORDINATION OF CARE:  15:53-Discussed planned course of treatment with the patient including a breathing treatment, who is agreeable at this time.   16:00-Medication Orders: Albuterol (Proventil) (5 mg/mL) 0.5% nebulizer solution 2.5 mg-once; Ipratropium (Atrovent) nebulizer solution 0.5 mg-once.   Labs Reviewed - No data to display No results found.   No diagnosis found.    MDM  Pt improved with tx   The chart was scribed for me under my direct supervision.  I personally performed the history, physical, and medical decision making and all procedures in the evaluation of this patient.Benny Lennert, MD 01/24/13 2007

## 2013-01-24 NOTE — ED Notes (Signed)
Pt states she had an "asthma attack" this morning. Pt is currently out of her inhaler and states she is here for a refill. Pt had left side inspiratory wheezing on assessment. Pt states symptoms are "better" but still reports chest tightness and SOB. NAD.

## 2013-09-19 ENCOUNTER — Encounter (HOSPITAL_COMMUNITY): Payer: Self-pay | Admitting: Emergency Medicine

## 2013-09-19 ENCOUNTER — Emergency Department (HOSPITAL_COMMUNITY)
Admission: EM | Admit: 2013-09-19 | Discharge: 2013-09-19 | Disposition: A | Discharge status: Home / Self Care | Payer: Medicare Other | Attending: Emergency Medicine | Admitting: Emergency Medicine

## 2013-09-19 DIAGNOSIS — J441 Chronic obstructive pulmonary disease with (acute) exacerbation: Secondary | ICD-10-CM

## 2013-09-19 DIAGNOSIS — J45909 Unspecified asthma, uncomplicated: Secondary | ICD-10-CM

## 2013-09-19 DIAGNOSIS — Z87891 Personal history of nicotine dependence: Secondary | ICD-10-CM | POA: Insufficient documentation

## 2013-09-19 DIAGNOSIS — Z79899 Other long term (current) drug therapy: Secondary | ICD-10-CM | POA: Diagnosis not present

## 2013-09-19 MED ORDER — IPRATROPIUM BROMIDE 0.02 % IN SOLN
0.5000 mg | Freq: Once | RESPIRATORY_TRACT | Status: AC
  Administered 2013-09-19: 0.5 mg via RESPIRATORY_TRACT
  Filled 2013-09-19: qty 2.5

## 2013-09-19 MED ORDER — ALBUTEROL SULFATE (5 MG/ML) 0.5% IN NEBU
2.5000 mg | INHALATION_SOLUTION | Freq: Once | RESPIRATORY_TRACT | Status: AC
  Administered 2013-09-19: 2.5 mg via RESPIRATORY_TRACT
  Filled 2013-09-19: qty 0.5

## 2013-09-19 MED ORDER — ALBUTEROL SULFATE HFA 108 (90 BASE) MCG/ACT IN AERS
2.0000 | INHALATION_SPRAY | Freq: Four times a day (QID) | RESPIRATORY_TRACT | Status: DC
  Administered 2013-09-19: 2 via RESPIRATORY_TRACT
  Filled 2013-09-19: qty 6.7

## 2013-09-19 NOTE — ED Notes (Signed)
Patient presents to ER with c/o asthma exacerbation.

## 2013-09-19 NOTE — ED Provider Notes (Signed)
CSN: [id number]     Arrival date & time 09/19/13  0124 History   First MD Initiated Contact with Patient 09/19/13 0146     Chief Complaint  Patient presents with  . Asthma   (Consider location/radiation/quality/duration/timing/severity/associated sxs/prior Treatment) Patient is a 67 y.o. female presenting with asthma. The history is provided by the patient.  Asthma Associated symptoms include shortness of breath. Pertinent negatives include no chest pain, no abdominal pain and no headaches.   patient currently without a primary care provider. Patient has a known history of COPD and asthma is not on oxygen at home. Patient ran out of her albuterol inhaler 3-4 days ago. Patient been having increased shortness of breath with wheezing over the past 2 days. No change in her normal productive cough no fevers.  Past Medical History  Diagnosis Date  . Asthma   . COPD (chronic obstructive pulmonary disease)    Past Surgical History  Procedure Laterality Date  . Cholecystectomy    . Arm surgery     No family history on file. History  Substance Use Topics  . Smoking status: Former Games developer  . Smokeless tobacco: Not on file  . Alcohol Use: No   OB History   Grav Para Term Preterm Abortions TAB SAB Ect Mult Living                 Review of Systems  Constitutional: Negative for fever.  HENT: Negative for congestion.   Eyes: Negative for redness.  Respiratory: Positive for cough, shortness of breath and wheezing.   Cardiovascular: Negative for chest pain.  Gastrointestinal: Negative for abdominal pain.  Genitourinary: Negative for dysuria.  Musculoskeletal: Negative for back pain.  Skin: Negative for rash.  Neurological: Negative for headaches.  Hematological: Does not bruise/bleed easily.  Psychiatric/Behavioral: Negative for confusion.    Allergies  Other  Home Medications   Current Outpatient Rx  Name  Route  Sig  Dispense  Refill  . acetaminophen (TYLENOL) 500 MG tablet  Oral   Take 1,000 mg by mouth every 6 (six) hours as needed. Pain.         Marland Kitchen albuterol (PROVENTIL HFA;VENTOLIN HFA) 108 (90 BASE) MCG/ACT inhaler   Inhalation   Inhale 2 puffs into the lungs every 4 (four) hours as needed for wheezing.   1 Inhaler   0   . albuterol (PROVENTIL HFA;VENTOLIN HFA) 108 (90 BASE) MCG/ACT inhaler   Inhalation   Inhale 1-2 puffs into the lungs every 6 (six) hours as needed for wheezing.   1 Inhaler   0   . zolpidem (AMBIEN) 5 MG tablet   Oral   Take 1 tablet (5 mg total) by mouth at bedtime as needed for sleep.   30 tablet   0    BP 127/74  Temp(Src) 97.8 F (36.6 C) (Oral)  Resp 24  Ht 5\' 4"  (1.626 m)  Wt 100 lb (45.36 kg)  BMI 17.16 kg/m2  SpO2 98% Physical Exam  Nursing note and vitals reviewed. Constitutional: She is oriented to person, place, and time. She appears well-developed and well-nourished. No distress.  HENT:  Head: Normocephalic and atraumatic.  Mouth/Throat: Oropharynx is clear and moist.  Eyes: Conjunctivae and EOM are normal. Pupils are equal, round, and reactive to light.  Neck: Normal range of motion.  Cardiovascular: Normal rate and normal heart sounds.   No murmur heard. Pulmonary/Chest: Effort normal. She has wheezes.  Abdominal: Soft. Bowel sounds are normal. There is no tenderness.  Musculoskeletal: Normal  range of motion.  Neurological: She is alert and oriented to person, place, and time. No cranial nerve deficit. She exhibits normal muscle tone. Coordination normal.  Skin: Skin is warm. No rash noted.    ED Course  Procedures (including critical care time) Labs Review Labs Reviewed - No data to display Imaging Review No results found.  MDM   1. Asthma   2. COPD exacerbation    Patient with a known history of COPD and asthma ran out of her albuterol inhaler 3-4 days ago. Patient with breathing problems for the past 2 days it has been worse. Patient received albuterol nebulizer here in the emergency  department with complete clearing of all wheezing. Patient feels much better. Patient has no fever or change in her normal productive cough clinically no concerns for pneumonia. Patient given albuterol inhaler to go home with.    Shelda Jakes, MD 09/19/13 [phone]

## 2013-09-19 NOTE — ED Notes (Signed)
MD at bedside. 

## 2013-10-04 ENCOUNTER — Emergency Department (HOSPITAL_COMMUNITY)
Admission: EM | Admit: 2013-10-04 | Discharge: 2013-10-05 | Disposition: A | Discharge status: Home / Self Care | Payer: Medicare Other | Attending: Emergency Medicine | Admitting: Emergency Medicine

## 2013-10-04 ENCOUNTER — Emergency Department (HOSPITAL_COMMUNITY): Payer: Medicare Other

## 2013-10-04 ENCOUNTER — Encounter (HOSPITAL_COMMUNITY): Payer: Self-pay | Admitting: Emergency Medicine

## 2013-10-04 DIAGNOSIS — J438 Other emphysema: Secondary | ICD-10-CM | POA: Diagnosis not present

## 2013-10-04 DIAGNOSIS — Z87891 Personal history of nicotine dependence: Secondary | ICD-10-CM | POA: Diagnosis not present

## 2013-10-04 DIAGNOSIS — J441 Chronic obstructive pulmonary disease with (acute) exacerbation: Secondary | ICD-10-CM | POA: Insufficient documentation

## 2013-10-04 DIAGNOSIS — Z79899 Other long term (current) drug therapy: Secondary | ICD-10-CM | POA: Insufficient documentation

## 2013-10-04 DIAGNOSIS — R0602 Shortness of breath: Secondary | ICD-10-CM | POA: Diagnosis not present

## 2013-10-04 MED ORDER — IPRATROPIUM BROMIDE 0.02 % IN SOLN: 0.5000 mg | Freq: Once | RESPIRATORY_TRACT | Status: AC

## 2013-10-04 MED ORDER — PREDNISONE 50 MG PO TABS
60.0000 mg | ORAL_TABLET | Freq: Once | ORAL | Status: AC
  Administered 2013-10-04: 60 mg via ORAL
  Filled 2013-10-04 (×2): qty 1

## 2013-10-04 MED ORDER — IPRATROPIUM BROMIDE 0.02 % IN SOLN
0.5000 mg | Freq: Once | RESPIRATORY_TRACT | Status: AC
  Administered 2013-10-04: 0.5 mg via RESPIRATORY_TRACT
  Filled 2013-10-04: qty 2.5

## 2013-10-04 MED ORDER — ALBUTEROL SULFATE (5 MG/ML) 0.5% IN NEBU
INHALATION_SOLUTION | RESPIRATORY_TRACT | Status: AC
  Administered 2013-10-04: 5 mg
  Filled 2013-10-04: qty 1

## 2013-10-04 MED ORDER — ALBUTEROL SULFATE (5 MG/ML) 0.5% IN NEBU: 2.5000 mg | INHALATION_SOLUTION | Freq: Once | RESPIRATORY_TRACT | Status: AC

## 2013-10-04 MED ORDER — IPRATROPIUM BROMIDE 0.02 % IN SOLN
RESPIRATORY_TRACT | Status: AC
  Administered 2013-10-04: 0.5 mg
  Filled 2013-10-04: qty 2.5

## 2013-10-04 MED ORDER — ALBUTEROL SULFATE (5 MG/ML) 0.5% IN NEBU
2.5000 mg | INHALATION_SOLUTION | Freq: Once | RESPIRATORY_TRACT | Status: AC
  Administered 2013-10-04: 2.5 mg via RESPIRATORY_TRACT
  Filled 2013-10-04: qty 0.5

## 2013-10-04 NOTE — ED Notes (Signed)
Pt with worsening wheezing since yesterday, using inhaler without relief

## 2013-10-04 NOTE — ED Provider Notes (Signed)
CSN: [id number]     Arrival date & time 10/04/13  2314 History   This chart was scribed for Dione Booze, MD by Valera Castle, ED Scribe. This patient was seen in room APA14/APA14 and the patient's care was started at 11:28 PM.     Chief Complaint  Patient presents with  . Shortness of Breath  . Wheezing    The history is provided by the patient and a relative. No language interpreter was used.   HPI Comments: Tamara Michael is a 67 y.o. female with a h/o asthma and COPD who presents to the Emergency Department complaining of sudden, worsening, SOB and wheezing, onset yesterday. She reports associated cough, productive of green sputum along with some chest tightness. She reports running out of her inhaler, onset 2 days ago. She reports feeling better after her treatment in the hospital this visit. She denies fever, and any other associated symptoms. She reports being a former smoker, and denies EtOH use. She states her mother had a h/o asthma.   She denies being on Prednisone after discharge from her last hospital visit.  PCP- Catalina Pizza, MD   Past Medical History  Diagnosis Date  . Asthma   . COPD (chronic obstructive pulmonary disease)    Past Surgical History  Procedure Laterality Date  . Cholecystectomy    . Arm surgery     No family history on file. History  Substance Use Topics  . Smoking status: Former Games developer  . Smokeless tobacco: Not on file  . Alcohol Use: No   OB History   Grav Para Term Preterm Abortions TAB SAB Ect Mult Living                 Review of Systems  Constitutional: Negative for fever.  Respiratory: Positive for cough, chest tightness, shortness of breath and wheezing.   All other systems reviewed and are negative.   Allergies  Other  Home Medications   Current Outpatient Rx  Name  Route  Sig  Dispense  Refill  . acetaminophen (TYLENOL) 500 MG tablet   Oral   Take 1,000 mg by mouth every 6 (six) hours as needed. Pain.         Marland Kitchen  albuterol (PROVENTIL HFA;VENTOLIN HFA) 108 (90 BASE) MCG/ACT inhaler   Inhalation   Inhale 2 puffs into the lungs every 4 (four) hours as needed for wheezing.   1 Inhaler   0   . albuterol (PROVENTIL HFA;VENTOLIN HFA) 108 (90 BASE) MCG/ACT inhaler   Inhalation   Inhale 1-2 puffs into the lungs every 6 (six) hours as needed for wheezing.   1 Inhaler   0   . ibuprofen (ADVIL,MOTRIN) 200 MG tablet   Oral   Take 200 mg by mouth every 6 (six) hours as needed for pain.         Marland Kitchen zolpidem (AMBIEN) 5 MG tablet   Oral   Take 1 tablet (5 mg total) by mouth at bedtime as needed for sleep.   30 tablet   0    Triage Vitals: There were no vitals taken for this visit.  Physical Exam  Nursing note and vitals reviewed. Constitutional: She is oriented to person, place, and time. She appears well-developed and well-nourished. No distress.  Appears mildly dyspneic.   HENT:  Head: Normocephalic and atraumatic.  Eyes: EOM are normal.  Neck: Neck supple. No tracheal deviation present.  Cardiovascular: Normal rate.   Pulmonary/Chest: Effort normal. No respiratory distress.  Prolonged exulation phase.  No use of accessory muscles for respiration.   Musculoskeletal: Normal range of motion.  Neurological: She is alert and oriented to person, place, and time.  Skin: Skin is warm and dry.  Psychiatric: She has a normal mood and affect. Her behavior is normal.    ED Course  Procedures (including critical care time)  DIAGNOSTIC STUDIES: Oxygen Saturation is 98% on room air, normal by my interpretation.    COORDINATION OF CARE: 11:33 PM-Discussed treatment plan which includes CXR, Proventil, Atrovent, and Deltasone with pt at bedside and pt agreed to plan.    Dg Chest 2 View  10/05/2013   CLINICAL DATA:  Shortness of breath. Wheezing.  EXAM: CHEST  2 VIEW  COMPARISON:  11/20/2012  FINDINGS: Hyperinflation and scattered fibrosis consistent with emphysema. Calcified and tortuous aorta. The  heart size and mediastinal contours are within normal limits. Both lungs are clear. The visualized skeletal structures are unremarkable.  IMPRESSION: Emphysematous changes in the lungs. No evidence of active pulmonary disease. Stable appearance since previous study.   Electronically Signed   By: Burman Nieves M.D.   On: 10/05/2013 00:06    EKG Interpretation   None      Meds ordered this encounter  Medications  . albuterol (PROVENTIL) (5 MG/ML) 0.5% nebulizer solution 2.5 mg    Sig:   . ipratropium (ATROVENT) nebulizer solution 0.5 mg    Sig:   . ibuprofen (ADVIL,MOTRIN) 200 MG tablet    Sig: Take 200 mg by mouth every 6 (six) hours as needed for pain.  Marland Kitchen ipratropium (ATROVENT) 0.02 % nebulizer solution    Sig:     Knick, Kimberly   : cabinet override  . albuterol (PROVENTIL) (5 MG/ML) 0.5% nebulizer solution    Sig:     Knick, Kimberly   : cabinet override  . albuterol (PROVENTIL) (5 MG/ML) 0.5% nebulizer solution 2.5 mg    Sig:   . ipratropium (ATROVENT) nebulizer solution 0.5 mg    Sig:   . predniSONE (DELTASONE) tablet 60 mg    Sig:      MDM   1. COPD exacerbation    Exacerbation of COPD with asthma secondary to running out of her medication. She is somewhat better after albuterol with ipratropium but not back to baseline. She's given a dose of prednisone and albuterol with ipratropium and be repeated.  Following second treatment, she is completely back to baseline. Lungs are completely clear and she is no longer in any distress. She is discharged with prescription for prednisone and albuterol inhaler.   I personally performed the services described in this documentation, which was scribed in my presence. The recorded information has been reviewed and is accurate.     Dione Booze, MD 10/05/13 [id number]

## 2013-10-05 DIAGNOSIS — J438 Other emphysema: Secondary | ICD-10-CM | POA: Diagnosis not present

## 2013-10-05 MED ORDER — PREDNISONE 50 MG PO TABS: 50.0000 mg | ORAL_TABLET | Freq: Every day | ORAL | Status: DC

## 2013-10-05 MED ORDER — ALBUTEROL SULFATE HFA 108 (90 BASE) MCG/ACT IN AERS
2.0000 | INHALATION_SPRAY | Freq: Once | RESPIRATORY_TRACT | Status: AC
  Administered 2013-10-05: 2 via RESPIRATORY_TRACT
  Filled 2013-10-05: qty 6.7

## 2013-10-05 MED ORDER — ALBUTEROL SULFATE HFA 108 (90 BASE) MCG/ACT IN AERS: 2.0000 | INHALATION_SPRAY | RESPIRATORY_TRACT | Status: DC | PRN

## 2013-12-08 ENCOUNTER — Emergency Department (HOSPITAL_COMMUNITY): Payer: Medicare Other

## 2013-12-08 ENCOUNTER — Encounter (HOSPITAL_COMMUNITY): Payer: Self-pay | Admitting: Emergency Medicine

## 2013-12-08 ENCOUNTER — Emergency Department (HOSPITAL_COMMUNITY)
Admission: EM | Admit: 2013-12-08 | Discharge: 2013-12-08 | Disposition: A | Discharge status: Home / Self Care | Payer: Medicare Other | Attending: Emergency Medicine | Admitting: Emergency Medicine

## 2013-12-08 DIAGNOSIS — Z87891 Personal history of nicotine dependence: Secondary | ICD-10-CM | POA: Insufficient documentation

## 2013-12-08 DIAGNOSIS — Z79899 Other long term (current) drug therapy: Secondary | ICD-10-CM | POA: Insufficient documentation

## 2013-12-08 DIAGNOSIS — R0602 Shortness of breath: Secondary | ICD-10-CM | POA: Diagnosis not present

## 2013-12-08 DIAGNOSIS — J441 Chronic obstructive pulmonary disease with (acute) exacerbation: Secondary | ICD-10-CM | POA: Diagnosis not present

## 2013-12-08 DIAGNOSIS — J449 Chronic obstructive pulmonary disease, unspecified: Secondary | ICD-10-CM | POA: Diagnosis not present

## 2013-12-08 MED ORDER — ALBUTEROL (5 MG/ML) CONTINUOUS INHALATION SOLN
10.0000 mg/h | INHALATION_SOLUTION | Freq: Once | RESPIRATORY_TRACT | Status: AC
  Administered 2013-12-08: 10 mg/h via RESPIRATORY_TRACT
  Filled 2013-12-08: qty 20

## 2013-12-08 MED ORDER — PREDNISONE 50 MG PO TABS
60.0000 mg | ORAL_TABLET | Freq: Once | ORAL | Status: AC
  Administered 2013-12-08: 60 mg via ORAL
  Filled 2013-12-08 (×2): qty 1

## 2013-12-08 MED ORDER — ALBUTEROL SULFATE HFA 108 (90 BASE) MCG/ACT IN AERS: 2.0000 | INHALATION_SPRAY | Freq: Four times a day (QID) | RESPIRATORY_TRACT | Status: AC | PRN

## 2013-12-08 MED ORDER — IPRATROPIUM BROMIDE 0.02 % IN SOLN
0.5000 mg | Freq: Once | RESPIRATORY_TRACT | Status: AC
  Administered 2013-12-08: 0.5 mg via RESPIRATORY_TRACT
  Filled 2013-12-08: qty 2.5

## 2013-12-08 MED ORDER — ALBUTEROL SULFATE HFA 108 (90 BASE) MCG/ACT IN AERS
2.0000 | INHALATION_SPRAY | RESPIRATORY_TRACT | Status: DC
  Administered 2013-12-08: 2 via RESPIRATORY_TRACT
  Filled 2013-12-08 (×2): qty 6.7

## 2013-12-08 MED ORDER — PREDNISONE 10 MG PO TABS: 20.0000 mg | ORAL_TABLET | Freq: Every day | ORAL | Status: DC

## 2013-12-08 NOTE — ED Provider Notes (Signed)
CSN: [id number]     Arrival date & time 12/08/13  1301 History   This chart was scribed for Toy Baker, MD, by Yevette Edwards, ED Scribe. This patient was seen in room APA05/APA05 and the patient's care was started at 1:59 PM.  First MD Initiated Contact with Patient 12/08/13 1350     Chief Complaint  Patient presents with  . Shortness of Breath   HPI HPI Comments: Tamara Michael is a 67 y.o. Female, with a h/o asthma and COPD, who presents to the Emergency Department complaining of three days of SOB which is associated with a cough productive of clear phlegm. She usually uses albuterol, but she ran out of the medication three days ago. The pt states that her current symptoms are similar to prior pulmonary issues.  She also states that exposure to cold temperatures increases the SOB. The pt states she normally remains sitting up.  She denies nausea, emesis, diarrhea, fever, and chest pain. The pt is a former smoker, and she denies any current passive exposure. Nothing makes Her symptoms better.  Past Medical History  Diagnosis Date  . Asthma   . COPD (chronic obstructive pulmonary disease)    Past Surgical History  Procedure Laterality Date  . Cholecystectomy    . Arm surgery     No family history on file. History  Substance Use Topics  . Smoking status: Former Games developer  . Smokeless tobacco: Not on file  . Alcohol Use: No   No OB history provided.  Review of Systems  Constitutional: Negative for fever.  Respiratory: Positive for cough and shortness of breath.   Cardiovascular: Negative for chest pain.  Gastrointestinal: Negative for nausea, vomiting and diarrhea.  All other systems reviewed and are negative.    Allergies  Other  Home Medications   Current Outpatient Rx  Name  Route  Sig  Dispense  Refill  . acetaminophen (TYLENOL) 500 MG tablet   Oral   Take 1,000 mg by mouth every 6 (six) hours as needed. Pain.         Marland Kitchen albuterol (PROVENTIL HFA;VENTOLIN  HFA) 108 (90 BASE) MCG/ACT inhaler   Inhalation   Inhale 2 puffs into the lungs every 4 (four) hours as needed for wheezing.   1 Inhaler   0   . albuterol (PROVENTIL HFA;VENTOLIN HFA) 108 (90 BASE) MCG/ACT inhaler   Inhalation   Inhale 1-2 puffs into the lungs every 6 (six) hours as needed for wheezing.   1 Inhaler   0   . albuterol (PROVENTIL HFA;VENTOLIN HFA) 108 (90 BASE) MCG/ACT inhaler   Inhalation   Inhale 2 puffs into the lungs every 2 (two) hours as needed for wheezing or shortness of breath (cough).   1 Inhaler   0   . ibuprofen (ADVIL,MOTRIN) 200 MG tablet   Oral   Take 200 mg by mouth every 6 (six) hours as needed for pain.         . predniSONE (DELTASONE) 50 MG tablet   Oral   Take 1 tablet (50 mg total) by mouth daily.   5 tablet   0   . zolpidem (AMBIEN) 5 MG tablet   Oral   Take 1 tablet (5 mg total) by mouth at bedtime as needed for sleep.   30 tablet   0    Triage Vitals: BP 103/70  Pulse 115  Temp(Src) 97.2 F (36.2 C) (Oral)  Resp 16  Ht 5\' 4"  (1.626 m)  Wt 101 lb (  45.813 kg)  BMI 17.33 kg/m2  SpO2 97%  Physical Exam  Nursing note and vitals reviewed. Constitutional: She is oriented to person, place, and time. She appears well-developed. No distress.  HENT:  Head: Normocephalic and atraumatic.  Eyes: Conjunctivae and EOM are normal. Pupils are equal, round, and reactive to light. No scleral icterus.  Neck: Normal range of motion. Neck supple. No tracheal deviation present. No thyromegaly present.  Cardiovascular: Normal rate and regular rhythm.  Exam reveals no gallop and no friction rub.   No murmur heard. Pulmonary/Chest: Effort normal. No respiratory distress. She has wheezes. She has no rales.  Expiratory wheezes bilaterally.   Abdominal: Soft. Bowel sounds are normal. She exhibits no distension. There is no tenderness. There is no rebound.  Musculoskeletal: Normal range of motion.  Neurological: She is alert and oriented to person,  place, and time.  Skin: Skin is warm and dry. No rash noted.  Psychiatric: She has a normal mood and affect. Her behavior is normal.    ED Course  Procedures (including critical care time)  DIAGNOSTIC STUDIES: Oxygen Saturation is 97% on room air, normal by my interpretation.    COORDINATION OF CARE:  2:03 PM- Discussed treatment plan with patient, which includes a breathing treatment, and the patient agreed to the plan.   Labs Review Labs Reviewed - No data to display Imaging Review Dg Chest Portable 1 View  12/08/2013   CLINICAL DATA:  Short of breath.  Asthma  EXAM: PORTABLE CHEST - 1 VIEW  COMPARISON:  10/04/2013  FINDINGS: COPD with hyperinflation of the lungs, unchanged. Negative for pneumonia. Negative for heart failure effusion or mass.  IMPRESSION: COPD with hyperinflation.  No superimposed acute abnormality.   Electronically Signed   By: Marlan Palau M.D.   On: 12/08/2013 13:47    EKG Interpretation    Date/Time:  Tuesday December 08 2013 13:09:15 EST Ventricular Rate:  92 PR Interval:  134 QRS Duration: 70 QT Interval:  340 QTC Calculation: 420 R Axis:   86 Text Interpretation:  Normal sinus rhythm Normal ECG When compared with ECG of 15-May-2012 01:02, Vent. rate has increased BY  37 BPM Confirmed by Silvanna Ohmer  MD, Tennis Mckinnon ([id number]) on 12/08/2013 2:15:05 PM            MDM  No diagnosis found.   I personally performed the services described in this documentation, which was scribed in my presence. The recorded information has been reviewed and is accurate.   3:51 PM Patient given albuterol and prednisone here. We assessed and wheezing has greatly improved. Will be given prescription for prednisone as well as Proventil inhaler   Toy Baker, MD 12/08/13 [phone]

## 2013-12-08 NOTE — ED Notes (Signed)
Pt reports being sob since Sunday, +cough, no nausea or vomiting. Denies any cp.

## 2013-12-08 NOTE — ED Notes (Signed)
Pt c/o cough and SOB since Sunday. Pt has hx of COPD and asthma and typically uses albuterol inhaler at but states she is currently out of the inhaler. Cough is productive with clear sputum. Pt denies chest pain, fever, N/V/D.

## 2013-12-23 DIAGNOSIS — J45909 Unspecified asthma, uncomplicated: Secondary | ICD-10-CM | POA: Diagnosis not present

## 2013-12-23 DIAGNOSIS — Z0000 Encounter for general adult medical examination without abnormal findings: Secondary | ICD-10-CM | POA: Diagnosis not present

## 2013-12-23 DIAGNOSIS — D649 Anemia, unspecified: Secondary | ICD-10-CM | POA: Diagnosis not present

## 2014-02-03 DIAGNOSIS — J45909 Unspecified asthma, uncomplicated: Secondary | ICD-10-CM | POA: Diagnosis not present

## 2014-03-08 ENCOUNTER — Other Ambulatory Visit (HOSPITAL_COMMUNITY): Payer: Self-pay

## 2014-03-08 DIAGNOSIS — R0602 Shortness of breath: Secondary | ICD-10-CM

## 2014-03-23 ENCOUNTER — Ambulatory Visit (HOSPITAL_COMMUNITY): Admission: RE | Admit: 2014-03-23 | Payer: Medicare Other | Source: Ambulatory Visit

## 2014-03-25 ENCOUNTER — Ambulatory Visit (HOSPITAL_COMMUNITY)
Admission: RE | Admit: 2014-03-25 | Discharge: 2014-03-25 | Disposition: A | Discharge status: Home / Self Care | Payer: Medicare Other | Source: Ambulatory Visit | Attending: Internal Medicine | Admitting: Internal Medicine

## 2014-03-25 DIAGNOSIS — R0602 Shortness of breath: Secondary | ICD-10-CM | POA: Diagnosis not present

## 2014-03-25 MED ORDER — ALBUTEROL SULFATE (2.5 MG/3ML) 0.083% IN NEBU: 2.5000 mg | INHALATION_SOLUTION | Freq: Once | RESPIRATORY_TRACT | Status: DC

## 2014-06-03 DIAGNOSIS — G4700 Insomnia, unspecified: Secondary | ICD-10-CM | POA: Diagnosis not present

## 2014-06-03 DIAGNOSIS — J45909 Unspecified asthma, uncomplicated: Secondary | ICD-10-CM | POA: Diagnosis not present

## 2014-06-03 DIAGNOSIS — F5102 Adjustment insomnia: Secondary | ICD-10-CM | POA: Diagnosis not present

## 2014-06-08 ENCOUNTER — Emergency Department (HOSPITAL_COMMUNITY)
Admission: EM | Admit: 2014-06-08 | Discharge: 2014-06-08 | Disposition: A | Discharge status: Home / Self Care | Payer: Medicare Other | Attending: Emergency Medicine | Admitting: Emergency Medicine

## 2014-06-08 ENCOUNTER — Encounter (HOSPITAL_COMMUNITY): Payer: Self-pay | Admitting: Emergency Medicine

## 2014-06-08 ENCOUNTER — Other Ambulatory Visit: Payer: Self-pay

## 2014-06-08 ENCOUNTER — Emergency Department (HOSPITAL_COMMUNITY): Payer: Medicare Other

## 2014-06-08 DIAGNOSIS — J441 Chronic obstructive pulmonary disease with (acute) exacerbation: Secondary | ICD-10-CM | POA: Diagnosis not present

## 2014-06-08 DIAGNOSIS — R0789 Other chest pain: Secondary | ICD-10-CM | POA: Diagnosis not present

## 2014-06-08 DIAGNOSIS — R0602 Shortness of breath: Secondary | ICD-10-CM | POA: Diagnosis not present

## 2014-06-08 DIAGNOSIS — R0600 Dyspnea, unspecified: Secondary | ICD-10-CM

## 2014-06-08 DIAGNOSIS — IMO0002 Reserved for concepts with insufficient information to code with codable children: Secondary | ICD-10-CM | POA: Diagnosis not present

## 2014-06-08 DIAGNOSIS — Z87891 Personal history of nicotine dependence: Secondary | ICD-10-CM | POA: Diagnosis not present

## 2014-06-08 DIAGNOSIS — J449 Chronic obstructive pulmonary disease, unspecified: Secondary | ICD-10-CM | POA: Diagnosis not present

## 2014-06-08 DIAGNOSIS — Z79899 Other long term (current) drug therapy: Secondary | ICD-10-CM | POA: Diagnosis not present

## 2014-06-08 DIAGNOSIS — J45901 Unspecified asthma with (acute) exacerbation: Principal | ICD-10-CM

## 2014-06-08 LAB — CBC WITH DIFFERENTIAL/PLATELET
Basophils Absolute: 0 10*3/uL (ref 0.0–0.1)
Basophils Relative: 1 % (ref 0–1)
Eosinophils Absolute: 0.4 10*3/uL (ref 0.0–0.7)
Eosinophils Relative: 9 % — ABNORMAL HIGH (ref 0–5)
HCT: 38.3 % (ref 36.0–46.0)
Hemoglobin: 12.7 g/dL (ref 12.0–15.0)
Lymphocytes Relative: 30 % (ref 12–46)
Lymphs Abs: 1.3 10*3/uL (ref 0.7–4.0)
MCH: 29.8 pg (ref 26.0–34.0)
MCHC: 33.2 g/dL (ref 30.0–36.0)
MCV: 89.9 fL (ref 78.0–100.0)
Monocytes Absolute: 0.4 10*3/uL (ref 0.1–1.0)
Monocytes Relative: 8 % (ref 3–12)
Neutro Abs: 2.3 10*3/uL (ref 1.7–7.7)
Neutrophils Relative %: 52 % (ref 43–77)
Platelets: 244 10*3/uL (ref 150–400)
RBC: 4.26 MIL/uL (ref 3.87–5.11)
RDW: 13.6 % (ref 11.5–15.5)
WBC: 4.4 10*3/uL (ref 4.0–10.5)

## 2014-06-08 LAB — BASIC METABOLIC PANEL
BUN: 19 mg/dL (ref 6–23)
CO2: 25 mEq/L (ref 19–32)
Calcium: 8.6 mg/dL (ref 8.4–10.5)
Chloride: 104 mEq/L (ref 96–112)
Creatinine, Ser: 0.92 mg/dL (ref 0.50–1.10)
GFR calc Af Amer: 73 mL/min — ABNORMAL LOW (ref >90)
GFR calc non Af Amer: 63 mL/min — ABNORMAL LOW (ref >90)
Glucose, Bld: 90 mg/dL (ref 70–99)
Potassium: 3.9 mEq/L (ref 3.7–5.3)
Sodium: 140 mEq/L (ref 137–147)

## 2014-06-08 LAB — TROPONIN I: Troponin I: <0.3 ng/mL (ref <0.30)

## 2014-06-08 LAB — PRO B NATRIURETIC PEPTIDE: Pro B Natriuretic peptide (BNP): 283.3 pg/mL — ABNORMAL HIGH (ref 0–125)

## 2014-06-08 MED ORDER — ALBUTEROL SULFATE HFA 108 (90 BASE) MCG/ACT IN AERS
INHALATION_SPRAY | RESPIRATORY_TRACT | Status: AC
  Filled 2014-06-08: qty 6.7

## 2014-06-08 MED ORDER — ALBUTEROL SULFATE HFA 108 (90 BASE) MCG/ACT IN AERS
2.0000 | INHALATION_SPRAY | Freq: Once | RESPIRATORY_TRACT | Status: AC
  Administered 2014-06-08: 2 via RESPIRATORY_TRACT

## 2014-06-08 NOTE — ED Notes (Signed)
Short of breath

## 2014-06-08 NOTE — Discharge Instructions (Signed)

## 2014-06-08 NOTE — ED Provider Notes (Signed)
CSN: [id number]     Arrival date & time 06/08/14  1431 History  This chart was scribed for Raeford RazorStephen Kohut, MD by Leone PayorSonum Patel, ED Scribe. This patient was seen in room APA06/APA06 and the patient's care was started 2:57 PM.    Chief Complaint  Patient presents with  . Shortness of Breath      The history is provided by the patient. No language interpreter was used.   HPI Comments: Tamara Michael is a 68 y.o. female who presents to the Emergency Department complaining of constant SOB with associated chest tightness that occurred earlier today but has since resolved. She reports the SOB and chest tightness lasted for a few hours and was unchanged with a nebulizer treatment. She denies nausea, diaphoresis, leg swelling, leg pain. She denies history of cardiac catheterizations or a recent stress test.   PCP Catalina PizzaZach Hall   Past Medical History  Diagnosis Date  . Asthma   . COPD (chronic obstructive pulmonary disease)    Past Surgical History  Procedure Laterality Date  . Cholecystectomy    . Arm surgery     No family history on file. History  Substance Use Topics  . Smoking status: Former Games developermoker  . Smokeless tobacco: Not on file  . Alcohol Use: No   OB History   Grav Para Term Preterm Abortions TAB SAB Ect Mult Living                 Review of Systems  Constitutional: Negative for diaphoresis.  Respiratory: Positive for chest tightness and shortness of breath.   Cardiovascular: Negative for leg swelling.  Musculoskeletal: Negative for myalgias.  All other systems reviewed and are negative.     Allergies  Other  Home Medications   Prior to Admission medications   Medication Sig Start Date End Date Taking? Authorizing Provider  acetaminophen (TYLENOL) 500 MG tablet Take 1,000 mg by mouth every 6 (six) hours as needed. Pain.    Historical Provider, MD  albuterol (PROVENTIL HFA;VENTOLIN HFA) 108 (90 BASE) MCG/ACT inhaler Inhale 2 puffs into the lungs every 4 (four) hours  as needed for wheezing. 11/20/12   Lyanne CoKevin M Campos, MD  albuterol (PROVENTIL HFA;VENTOLIN HFA) 108 (90 BASE) MCG/ACT inhaler Inhale 2 puffs into the lungs every 6 (six) hours as needed for wheezing or shortness of breath. 12/08/13   Toy BakerAnthony T Hannie Shoe, MD  predniSONE (DELTASONE) 10 MG tablet Take 2 tablets (20 mg total) by mouth daily. 12/08/13   Toy BakerAnthony T Tylah Mancillas, MD   BP 119/65  Pulse 51  Temp(Src) 97.7 F (36.5 C) (Oral)  Ht 5\' 4"  (1.626 m)  Wt 106 lb (48.081 kg)  BMI 18.19 kg/m2  SpO2 100% Physical Exam  Nursing note and vitals reviewed. Constitutional: She is oriented to person, place, and time. She appears well-developed and well-nourished. No distress.  HENT:  Head: Normocephalic and atraumatic.  Eyes: EOM are normal.  Neck: Normal range of motion.  Cardiovascular: Normal rate, regular rhythm and normal heart sounds.   Pulmonary/Chest: Effort normal and breath sounds normal. No respiratory distress. She has no wheezes. She has no rales. She exhibits no tenderness.  Abdominal: Soft. She exhibits no distension. There is no tenderness.  Musculoskeletal: Normal range of motion. She exhibits no edema and no tenderness.  Negative Homans sign. No calf swelling or tenderness.   Neurological: She is alert and oriented to person, place, and time.  Skin: Skin is warm and dry.  Psychiatric: She has a normal mood and  affect. Judgment normal.    ED Course  Procedures (including critical care time)  DIAGNOSTIC STUDIES: Oxygen Saturation is 100% on 2L oxygen, normal by my interpretation.    COORDINATION OF CARE: 3:03 PM Discussed treatment plan with pt at bedside and pt agreed to plan.   Labs Review Labs Reviewed  PRO B NATRIURETIC PEPTIDE - Abnormal; Notable for the following:    Pro B Natriuretic peptide (BNP) 283.3 (*)    All other components within normal limits  CBC WITH DIFFERENTIAL - Abnormal; Notable for the following:    Eosinophils Relative 9 (*)    All other components within  normal limits  BASIC METABOLIC PANEL - Abnormal; Notable for the following:    GFR calc non Af Amer 63 (*)    GFR calc Af Amer 73 (*)    All other components within normal limits  TROPONIN I    Imaging Review No results found.  Dg Chest 2 View  06/08/2014   CLINICAL DATA:  Chest pain.  Dyspnea.  EXAM: CHEST  2 VIEW  COMPARISON:  12/08/2013  FINDINGS: Cardiac silhouette is normal in size. Aorta is tortuous. No mediastinal or hilar masses.  Lungs are hyperexpanded. No lung consolidation or edema. No lung mass or nodule. No pleural effusion or pneumothorax.  Bony thorax is demineralized. Mild compression deformity at the thoracolumbar junction is stable.  IMPRESSION: Advanced COPD.  No acute cardiopulmonary disease.   Electronically Signed   By: Amie Portlandavid  Ormond M.D.   On: 06/08/2014 15:26    EKG Interpretation None      EKG:  Rhythm: sinus bradycardia Vent. rate 55 BPM PR interval 142 ms QRS duration 80 ms QT/QTc 428/409 ms ST segments: NS ST changes No significant change from prior   MDM   Final diagnoses:  Dyspnea    68yF with dyspnea. Doubt anginal equivalent. EKG unchanged. CXR w/o acute findings. Mild wheezing on exam. No significant increase in WOB. Afebrile. Symptoms resolved with albuterol. Possible mild copd exac.    I personally preformed the services scribed in my presence. The recorded information has been reviewed is accurate. Raeford RazorStephen Kohut, MD.   Raeford RazorStephen Kohut, MD 06/13/14 [phone]

## 2014-12-07 DIAGNOSIS — D649 Anemia, unspecified: Secondary | ICD-10-CM | POA: Diagnosis not present

## 2014-12-07 DIAGNOSIS — I10 Essential (primary) hypertension: Secondary | ICD-10-CM | POA: Diagnosis not present

## 2014-12-28 DIAGNOSIS — G4700 Insomnia, unspecified: Secondary | ICD-10-CM | POA: Diagnosis not present

## 2014-12-28 DIAGNOSIS — E782 Mixed hyperlipidemia: Secondary | ICD-10-CM | POA: Diagnosis not present

## 2015-03-13 ENCOUNTER — Emergency Department (HOSPITAL_COMMUNITY)
Admission: EM | Admit: 2015-03-13 | Discharge: 2015-03-13 | Disposition: A | Discharge status: Home / Self Care | Payer: Medicare Other | Attending: Emergency Medicine | Admitting: Emergency Medicine

## 2015-03-13 ENCOUNTER — Emergency Department (HOSPITAL_COMMUNITY): Payer: Medicare Other

## 2015-03-13 ENCOUNTER — Encounter (HOSPITAL_COMMUNITY): Payer: Self-pay

## 2015-03-13 DIAGNOSIS — Z79899 Other long term (current) drug therapy: Secondary | ICD-10-CM | POA: Insufficient documentation

## 2015-03-13 DIAGNOSIS — J441 Chronic obstructive pulmonary disease with (acute) exacerbation: Secondary | ICD-10-CM | POA: Diagnosis not present

## 2015-03-13 DIAGNOSIS — J159 Unspecified bacterial pneumonia: Secondary | ICD-10-CM | POA: Diagnosis not present

## 2015-03-13 DIAGNOSIS — J9809 Other diseases of bronchus, not elsewhere classified: Secondary | ICD-10-CM | POA: Diagnosis not present

## 2015-03-13 DIAGNOSIS — Z87891 Personal history of nicotine dependence: Secondary | ICD-10-CM | POA: Insufficient documentation

## 2015-03-13 DIAGNOSIS — J189 Pneumonia, unspecified organism: Secondary | ICD-10-CM

## 2015-03-13 DIAGNOSIS — R0600 Dyspnea, unspecified: Secondary | ICD-10-CM

## 2015-03-13 DIAGNOSIS — R0602 Shortness of breath: Secondary | ICD-10-CM | POA: Diagnosis present

## 2015-03-13 LAB — BASIC METABOLIC PANEL
Anion gap: 7 (ref 5–15)
BUN: 12 mg/dL (ref 6–23)
CHLORIDE: 100 mmol/L (ref 96–112)
CO2: 25 mmol/L (ref 19–32)
Calcium: 8.5 mg/dL (ref 8.4–10.5)
Creatinine, Ser: 0.75 mg/dL (ref 0.50–1.10)
GFR calc non Af Amer: 86 mL/min — ABNORMAL LOW (ref >90)
Glucose, Bld: 133 mg/dL — ABNORMAL HIGH (ref 70–99)
Potassium: 3.7 mmol/L (ref 3.5–5.1)
Sodium: 132 mmol/L — ABNORMAL LOW (ref 135–145)

## 2015-03-13 LAB — CBC WITH DIFFERENTIAL/PLATELET
BASOS ABS: 0 10*3/uL (ref 0.0–0.1)
BASOS PCT: 0 % (ref 0–1)
EOS ABS: 0.1 10*3/uL (ref 0.0–0.7)
Eosinophils Relative: 1 % (ref 0–5)
HCT: 39.1 % (ref 36.0–46.0)
HEMOGLOBIN: 12.9 g/dL (ref 12.0–15.0)
Lymphocytes Relative: 16 % (ref 12–46)
Lymphs Abs: 1.2 10*3/uL (ref 0.7–4.0)
MCH: 29.1 pg (ref 26.0–34.0)
MCHC: 33 g/dL (ref 30.0–36.0)
MCV: 88.3 fL (ref 78.0–100.0)
Monocytes Absolute: 0.7 10*3/uL (ref 0.1–1.0)
Monocytes Relative: 10 % (ref 3–12)
NEUTROS PCT: 73 % (ref 43–77)
Neutro Abs: 5.3 10*3/uL (ref 1.7–7.7)
Platelets: 305 10*3/uL (ref 150–400)
RBC: 4.43 MIL/uL (ref 3.87–5.11)
RDW: 14.1 % (ref 11.5–15.5)
WBC: 7.3 10*3/uL (ref 4.0–10.5)

## 2015-03-13 LAB — BRAIN NATRIURETIC PEPTIDE: B Natriuretic Peptide: 35 pg/mL (ref 0.0–100.0)

## 2015-03-13 LAB — TROPONIN I: Troponin I: <0.03 ng/mL (ref <0.031)

## 2015-03-13 MED ORDER — IPRATROPIUM-ALBUTEROL 0.5-2.5 (3) MG/3ML IN SOLN
3.0000 mL | Freq: Once | RESPIRATORY_TRACT | Status: AC
  Administered 2015-03-13: 3 mL via RESPIRATORY_TRACT
  Filled 2015-03-13: qty 3

## 2015-03-13 MED ORDER — PREDNISONE 20 MG PO TABS
40.0000 mg | ORAL_TABLET | Freq: Once | ORAL | Status: AC
  Administered 2015-03-13: 40 mg via ORAL
  Filled 2015-03-13: qty 2

## 2015-03-13 MED ORDER — ALBUTEROL SULFATE HFA 108 (90 BASE) MCG/ACT IN AERS
4.0000 | INHALATION_SPRAY | Freq: Once | RESPIRATORY_TRACT | Status: AC
  Administered 2015-03-13: 4 via RESPIRATORY_TRACT
  Filled 2015-03-13: qty 6.7

## 2015-03-13 MED ORDER — GUAIFENESIN-CODEINE 100-10 MG/5ML PO SOLN
10.0000 mL | Freq: Four times a day (QID) | ORAL | Status: DC | PRN
Start: 2015-03-13 — End: 2021-08-31

## 2015-03-13 MED ORDER — LEVOFLOXACIN 500 MG PO TABS: 500.0000 mg | ORAL_TABLET | Freq: Every day | ORAL | Status: DC

## 2015-03-13 MED ORDER — PREDNISONE 20 MG PO TABS: 40.0000 mg | ORAL_TABLET | Freq: Every day | ORAL | Status: DC

## 2015-03-13 MED ORDER — ACETAMINOPHEN 500 MG PO TABS
1000.0000 mg | ORAL_TABLET | Freq: Once | ORAL | Status: AC
  Administered 2015-03-13: 1000 mg via ORAL
  Filled 2015-03-13: qty 2

## 2015-03-13 MED ORDER — ALBUTEROL SULFATE (2.5 MG/3ML) 0.083% IN NEBU: 2.5000 mg | INHALATION_SOLUTION | Freq: Four times a day (QID) | RESPIRATORY_TRACT | Status: AC | PRN

## 2015-03-13 MED ORDER — LEVOFLOXACIN 500 MG PO TABS
500.0000 mg | ORAL_TABLET | Freq: Once | ORAL | Status: AC
  Administered 2015-03-13: 500 mg via ORAL
  Filled 2015-03-13: qty 1

## 2015-03-13 NOTE — Discharge Instructions (Signed)
Chronic Obstructive Pulmonary Disease Exacerbation °Chronic obstructive pulmonary disease (COPD) is a common lung condition in which airflow from the lungs is limited. COPD is a general term that can be used to describe many different lung problems that limit airflow, including chronic bronchitis and emphysema. COPD exacerbations are episodes when breathing symptoms become much worse and require extra treatment. Without treatment, COPD exacerbations can be life threatening, and frequent COPD exacerbations can cause further damage to your lungs. °CAUSES  °· Respiratory infections.   °· Exposure to smoke.   °· Exposure to air pollution, chemical fumes, or dust. °Sometimes there is no apparent cause or trigger. °RISK FACTORS °· Smoking cigarettes. °· Older age. °· Frequent prior COPD exacerbations. °SIGNS AND SYMPTOMS  °· Increased coughing.   °· Increased thick spit (sputum) production.   °· Increased wheezing.   °· Increased shortness of breath.   °· Rapid breathing.   °· Chest tightness. °DIAGNOSIS  °Your medical history, a physical exam, and tests will help your health care provider make a diagnosis. Tests may include: °· A chest X-ray. °· Basic lab tests. °· Sputum testing. °· An arterial blood gas test. °TREATMENT  °Depending on the severity of your COPD exacerbation, you may need to be admitted to a hospital for treatment. Some of the treatments commonly used to treat COPD exacerbations are:  °· Antibiotic medicines.   °· Bronchodilators. These are drugs that expand the air passages. They may be given with an inhaler or nebulizer. Spacer devices may be needed to help improve drug delivery. °· Corticosteroid medicines. °· Supplemental oxygen therapy.   °HOME CARE INSTRUCTIONS  °· Do not smoke. Quitting smoking is very important to prevent COPD from getting worse and exacerbations from happening as often. °· Avoid exposure to all substances that irritate the airway, especially to tobacco smoke.   °· If you were  prescribed an antibiotic medicine, finish it all even if you start to feel better. °· Take all medicines as directed by your health care provider. It is important to use correct technique with inhaled medicines. °· Drink enough fluids to keep your urine clear or pale yellow (unless you have a medical condition that requires fluid restriction). °· Use a cool mist vaporizer. This makes it easier to clear your chest when you cough.   °· If you have a home nebulizer and oxygen, continue to use them as directed.   °· Maintain all necessary vaccinations to prevent infections.   °· Exercise regularly.   °· Eat a healthy diet.   °· Keep all follow-up appointments as directed by your health care provider. °SEEK IMMEDIATE MEDICAL CARE IF: °· You have worsening shortness of breath.   °· You have trouble talking.   °· You have severe chest pain. °· You have blood in your sputum.  °· You have a fever. °· You have weakness, vomit repeatedly, or faint.   °· You feel confused.   °· You continue to get worse. °MAKE SURE YOU:  °· Understand these instructions. °· Will watch your condition. °· Will get help right away if you are not doing well or get worse. °Document Released: 09/30/2007 Document Revised: 04/19/2014 Document Reviewed: 08/07/2013 °[employer]® Patient Information ©2015 [employer]. This information is not intended to replace advice given to you by your health care provider. Make sure you discuss any questions you have with your health care provider. ° °Pneumonia °Pneumonia is an infection of the lungs.  °CAUSES °Pneumonia may be caused by bacteria or a virus. Usually, these infections are caused by breathing infectious particles into the lungs (respiratory tract). °SIGNS AND SYMPTOMS  °· Cough. °·   Fever. °· Chest pain. °· Increased rate of breathing. °· Wheezing. °· Mucus production. °DIAGNOSIS  °If you have the common symptoms of pneumonia, your health care provider will typically confirm the diagnosis with a chest  X-ray. The X-ray will show an abnormality in the lung (pulmonary infiltrate) if you have pneumonia. Other tests of your blood, urine, or sputum may be done to find the specific cause of your pneumonia. Your health care provider may also do tests (blood gases or pulse oximetry) to see how well your lungs are working. °TREATMENT  °Some forms of pneumonia may be spread to other people when you cough or sneeze. You may be asked to wear a mask before and during your exam. Pneumonia that is caused by bacteria is treated with antibiotic medicine. Pneumonia that is caused by the influenza virus may be treated with an antiviral medicine. Most other viral infections must run their course. These infections will not respond to antibiotics.  °HOME CARE INSTRUCTIONS  °· Cough suppressants may be used if you are losing too much rest. However, coughing protects you by clearing your lungs. You should avoid using cough suppressants if you can. °· Your health care provider may have prescribed medicine if he or she thinks your pneumonia is caused by bacteria or influenza. Finish your medicine even if you start to feel better. °· Your health care provider may also prescribe an expectorant. This loosens the mucus to be coughed up. °· Take medicines only as directed by your health care provider. °· Do not smoke. Smoking is a common cause of bronchitis and can contribute to pneumonia. If you are a smoker and continue to smoke, your cough may last several weeks after your pneumonia has cleared. °· A cold steam vaporizer or humidifier in your room or home may help loosen mucus. °· Coughing is often worse at night. Sleeping in a semi-upright position in a recliner or using a couple pillows under your head will help with this. °· Get rest as you feel it is needed. Your body will usually let you know when you need to rest. °PREVENTION °A pneumococcal shot (vaccine) is available to prevent a common bacterial cause of pneumonia. This is usually  suggested for: °· People over 65 years old. °· Patients on chemotherapy. °· People with chronic lung problems, such as bronchitis or emphysema. °· People with immune system problems. °If you are over 65 or have a high risk condition, you may receive the pneumococcal vaccine if you have not received it before. In some countries, a routine influenza vaccine is also recommended. This vaccine can help prevent some cases of pneumonia. You may be offered the influenza vaccine as part of your care. °If you smoke, it is time to quit. You may receive instructions on how to stop smoking. Your health care provider can provide medicines and counseling to help you quit. °SEEK MEDICAL CARE IF: °You have a fever. °SEEK IMMEDIATE MEDICAL CARE IF:  °· Your illness becomes worse. This is especially true if you are elderly or weakened from any other disease. °· You cannot control your cough with suppressants and are losing sleep. °· You begin coughing up blood. °· You develop pain which is getting worse or is uncontrolled with medicines. °· Any of the symptoms which initially brought you in for treatment are getting worse rather than better. °· You develop shortness of breath or chest pain. °MAKE SURE YOU:  °· Understand these instructions. °· Will watch your condition. °· Will get help   right away if you are not doing well or get worse. °Document Released: 12/03/2005 Document Revised: 04/19/2014 Document Reviewed: 02/22/2011 °ExitCare® Patient Information ©2015 [employer]. This information is not intended to replace advice given to you by your health care provider. Make sure you discuss any questions you have with your health care provider. ° °

## 2015-03-13 NOTE — ED Provider Notes (Signed)
This chart was scribed for Tamara MawKristen N Ward, DO by Tonye RoyaltyJoshua Chen, ED Scribe. This patient was seen in room [facility]   TIME SEEN: 1:35 PM  CHIEF COMPLAINT: cough  HPI: Elvis CoilKathleen Brindley is a 69 y.o. female with history of asthma and COPD who presents to the Emergency Department complaining of cough and SOB with onset 1 week ago. She states cough produces yellow phlegm. She states she has sharp, achy chest pain underneath her breasts without radiation with cough. She states she improved with Prednisone the last time she had similar symptoms. She reports associated fever, last 3 days ago and measured at 101. She states she did not get a flu shot this year. She used a breathing treatment at home without remission of symptoms. She does not use O2 at home. She denies Hx of PE, DVT, MI, CAD.  ROS: See HPI Constitutional: positive fever Eyes: no drainage  ENT: no runny nose   Cardiovascular:  Positive chest pain Resp: positive cough, SOB GI: no vomiting GU: no dysuria Integumentary: no rash  Allergy: no hives  Musculoskeletal: no leg swelling  Neurological: no slurred speech ROS otherwise negative  PAST MEDICAL HISTORY/PAST SURGICAL HISTORY:  Past Medical History  Diagnosis Date  . Asthma   . COPD (chronic obstructive pulmonary disease)     MEDICATIONS:  Prior to Admission medications   Medication Sig Start Date End Date Taking? Authorizing Provider  albuterol (ACCUNEB) 1.25 MG/3ML nebulizer solution Take 1 ampule by nebulization every 6 (six) hours as needed for wheezing or shortness of breath.    Historical Provider, MD  albuterol (PROVENTIL HFA;VENTOLIN HFA) 108 (90 BASE) MCG/ACT inhaler Inhale 2 puffs into the lungs every 6 (six) hours as needed for wheezing or shortness of breath. 12/08/13   Lorre NickAnthony Allen, MD  ibuprofen (ADVIL,MOTRIN) 200 MG tablet Take 200 mg by mouth every 6 (six) hours as needed.    Historical Provider, MD  zolpidem (AMBIEN) 10 MG tablet Take 10 mg by mouth at  bedtime as needed for sleep.    Historical Provider, MD    ALLERGIES:  Allergies  Allergen Reactions  . Other Nausea And Vomiting    Anesthetics     SOCIAL HISTORY:  History  Substance Use Topics  . Smoking status: Former Games developermoker  . Smokeless tobacco: Not on file  . Alcohol Use: No     FAMILY HISTORY: No family history on file.  EXAM: BP 103/67 mmHg  Pulse 107  Temp(Src) 97.5 F (36.4 C)  Resp 22  Ht 5\' 4"  (1.626 m)  Wt 112 lb (50.803 kg)  BMI 19.22 kg/m2  SpO2 97% CONSTITUTIONAL: Alert and oriented and responds appropriately to questions. Chronically ill-appearing HEAD: Normocephalic EYES: Conjunctivae clear, PERRL ENT: normal nose; no rhinorrhea; moist mucous membranes; pharynx without lesions noted NECK: Supple, no meningismus, no LAD  CARD: Regular and tachycardic, S1 and S2 appreciated; no murmurs, no clicks, no rubs, no gallops RESP: Normal chest excursion without splinting or tachypnea; breath sounds equal bilaterally; no rhonchi, no rales,  No respiratory distress, speaking short sentences, diffuse expiratory wheezing worse on right than left, diminshed at bases  ABD/GI: Normal bowel sounds; non-distended; soft, non-tender, no rebound, no guarding BACK:  The back appears normal and is non-tender to palpation, there is no CVA tenderness EXT: Normal ROM in all joints; non-tender to palpation; no edema; normal capillary refill; no cyanosis, No calf tenderness or swelling SKIN: Normal color for age and race; warm NEURO: Moves all extremities equally PSYCH: The patient's  mood and manner are appropriate. Grooming and personal hygiene are appropriate.  MEDICAL DECISION MAKING: Patient here with COPD exacerbation. She is not hypoxic but is speaking short sentences. We'll give DuoNeb, prednisone. She also reports fever with yellow/green sputum production. Will obtain chest x-ray to evaluate for pneumonia. Also complaining of chest pain which is likely secondary to  musculoskeletal pain from coughing but will obtain EKG, troponin.  ED PROGRESS: Patient reports feeling much better now her lungs are clear with good aeration. No hypoxia or respiratory distress. She is able to speak full sentences without difficulty now. Patient's labs are unremarkable including negative troponin and normal BNP. Chest x-ray shows mild diffuse peribronchial cuffing suggesting bronchitis but there is also ill-defined opacity in the medial aspect of the right lung base that may be technique related versus pneumonia. She does not have a leukocytosis today or fever but reports fever of 101 at home with yellow/green sputum production. Given she has a history of COPD will treat with Levaquin. We'll discharge with albuterol and prednisone. Discussed return precautions. She has a PCP for follow-up. She verbalized understanding and is comfortable with plan.    EKG Interpretation  Date/Time:  Sunday March 13 2015 13:21:57 EDT Ventricular Rate:  103 PR Interval:  128 QRS Duration: 91 QT Interval:  323 QTC Calculation: 423 R Axis:   90 Text Interpretation:  Sinus tachycardia Sinus pause Borderline right axis deviation No significant change since Since last tracing Confirmed by WARD,  DO, Tamara [phone]) on 03/13/2015 2:07:47 PM        I personally performed the services described in this documentation, which was scribed in my presence. The recorded information has been reviewed and is accurate.    Tamara Michael Ward, DO 03/13/15 1445

## 2015-03-13 NOTE — ED Notes (Signed)
Pt states she has been SOB for a week. Also, has bronchitis

## 2015-04-08 DIAGNOSIS — E782 Mixed hyperlipidemia: Secondary | ICD-10-CM | POA: Diagnosis not present

## 2015-05-13 IMAGING — CR DG CHEST 1V PORT
1 series · 1 of 1 positions shown · non-contrast
Comparison: 10/04/2013

CLINICAL DATA: Short of breath.  Asthma

EXAM:
PORTABLE CHEST - 1 VIEW

[ap portable]
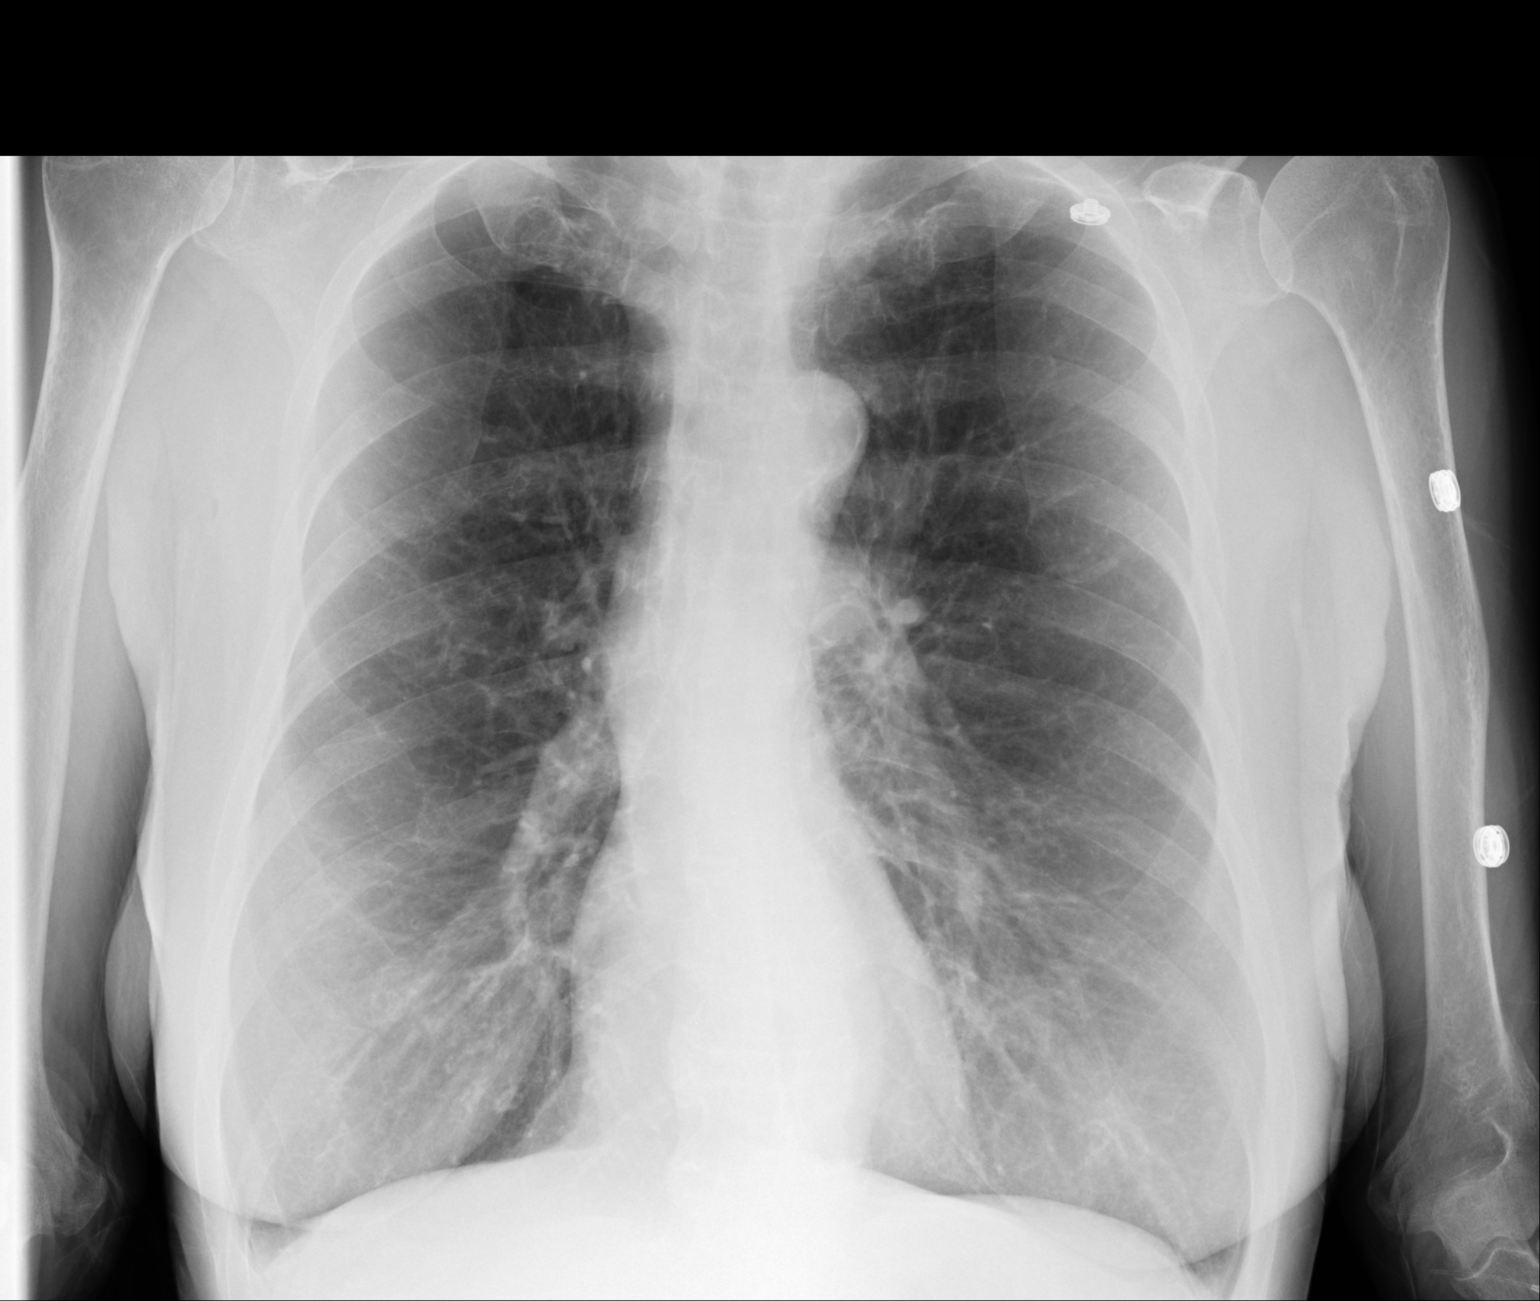

[1 of 1 positions shown; findings below may reference images not displayed]

FINDINGS: COPD with hyperinflation of the lungs, unchanged. Negative for
pneumonia. Negative for heart failure effusion or mass.
IMPRESSION: COPD with hyperinflation.  No superimposed acute abnormality.

## 2016-02-12 DIAGNOSIS — J45909 Unspecified asthma, uncomplicated: Secondary | ICD-10-CM | POA: Diagnosis not present

## 2016-02-12 DIAGNOSIS — Z7951 Long term (current) use of inhaled steroids: Secondary | ICD-10-CM | POA: Diagnosis not present

## 2016-02-12 DIAGNOSIS — F17200 Nicotine dependence, unspecified, uncomplicated: Secondary | ICD-10-CM | POA: Diagnosis not present

## 2016-02-12 DIAGNOSIS — R9431 Abnormal electrocardiogram [ECG] [EKG]: Secondary | ICD-10-CM | POA: Diagnosis not present

## 2016-02-12 DIAGNOSIS — J449 Chronic obstructive pulmonary disease, unspecified: Secondary | ICD-10-CM | POA: Diagnosis not present

## 2016-02-12 DIAGNOSIS — R000 Tachycardia, unspecified: Secondary | ICD-10-CM | POA: Diagnosis not present

## 2016-02-12 DIAGNOSIS — J45901 Unspecified asthma with (acute) exacerbation: Secondary | ICD-10-CM | POA: Diagnosis not present

## 2016-02-12 DIAGNOSIS — R0602 Shortness of breath: Secondary | ICD-10-CM | POA: Diagnosis not present

## 2016-02-13 DIAGNOSIS — R0602 Shortness of breath: Secondary | ICD-10-CM | POA: Diagnosis not present

## 2016-02-13 DIAGNOSIS — R9431 Abnormal electrocardiogram [ECG] [EKG]: Secondary | ICD-10-CM | POA: Diagnosis not present

## 2016-03-21 DIAGNOSIS — E782 Mixed hyperlipidemia: Secondary | ICD-10-CM | POA: Diagnosis not present

## 2016-03-23 DIAGNOSIS — J449 Chronic obstructive pulmonary disease, unspecified: Secondary | ICD-10-CM | POA: Diagnosis not present

## 2016-07-15 LAB — PULMONARY FUNCTION TEST
DL/VA % pred: 57 %
DL/VA: 2.74 ml/min/mmHg/L
DLCO unc % pred: 50 %
DLCO unc: 12.37 ml/min/mmHg
FEF 25-75 PRE: 0.45 L/s
FEF 25-75 Post: 0.98 L/sec
FEF2575-%CHANGE-POST: 119 %
FEF2575-%Pred-Post: 48 %
FEF2575-%Pred-Pre: 21 %
FEV1-%Change-Post: 41 %
FEV1-%PRED-POST: 62 %
FEV1-%PRED-PRE: 44 %
FEV1-POST: 1.48 L
FEV1-PRE: 1.05 L
FEV1FVC-%CHANGE-POST: 9 %
FEV1FVC-%PRED-PRE: 57 %
FEV6-%CHANGE-POST: 30 %
FEV6-%PRED-PRE: 75 %
FEV6-%Pred-Post: 98 %
FEV6-Post: 2.92 L
FEV6-Pre: 2.23 L
FEV6FVC-%CHANGE-POST: 0 %
FEV6FVC-%PRED-POST: 100 %
FEV6FVC-%Pred-Pre: 99 %
FVC-%CHANGE-POST: 29 %
FVC-%PRED-POST: 97 %
FVC-%Pred-Pre: 75 %
FVC-Post: 3.03 L
FVC-Pre: 2.34 L
PRE FEV1/FVC RATIO: 45 %
PRE FEV6/FVC RATIO: 96 %
Post FEV1/FVC ratio: 49 %
Post FEV6/FVC ratio: 96 %
RV % pred: 218 %
RV: 4.66 L
TLC % PRED: 139 %
TLC: 7.05 L

## 2017-03-29 DIAGNOSIS — Z0000 Encounter for general adult medical examination without abnormal findings: Secondary | ICD-10-CM | POA: Diagnosis not present

## 2017-08-15 DIAGNOSIS — Z1211 Encounter for screening for malignant neoplasm of colon: Secondary | ICD-10-CM | POA: Diagnosis not present

## 2017-08-15 DIAGNOSIS — Z1212 Encounter for screening for malignant neoplasm of rectum: Secondary | ICD-10-CM | POA: Diagnosis not present

## 2017-10-04 DIAGNOSIS — J45909 Unspecified asthma, uncomplicated: Secondary | ICD-10-CM | POA: Diagnosis not present

## 2017-10-04 DIAGNOSIS — R0602 Shortness of breath: Secondary | ICD-10-CM | POA: Diagnosis not present

## 2018-01-31 DIAGNOSIS — R0602 Shortness of breath: Secondary | ICD-10-CM | POA: Diagnosis not present

## 2018-01-31 DIAGNOSIS — R062 Wheezing: Secondary | ICD-10-CM | POA: Diagnosis not present

## 2018-01-31 DIAGNOSIS — J441 Chronic obstructive pulmonary disease with (acute) exacerbation: Secondary | ICD-10-CM | POA: Diagnosis not present

## 2018-01-31 DIAGNOSIS — Z87891 Personal history of nicotine dependence: Secondary | ICD-10-CM | POA: Diagnosis not present

## 2018-01-31 DIAGNOSIS — R000 Tachycardia, unspecified: Secondary | ICD-10-CM | POA: Diagnosis not present

## 2018-01-31 DIAGNOSIS — R05 Cough: Secondary | ICD-10-CM | POA: Diagnosis not present

## 2018-01-31 DIAGNOSIS — S32010A Wedge compression fracture of first lumbar vertebra, initial encounter for closed fracture: Secondary | ICD-10-CM | POA: Diagnosis not present

## 2018-02-01 DIAGNOSIS — R000 Tachycardia, unspecified: Secondary | ICD-10-CM | POA: Diagnosis not present

## 2018-02-24 DIAGNOSIS — J432 Centrilobular emphysema: Secondary | ICD-10-CM | POA: Insufficient documentation

## 2018-03-24 DIAGNOSIS — J449 Chronic obstructive pulmonary disease, unspecified: Secondary | ICD-10-CM | POA: Diagnosis not present

## 2018-03-24 DIAGNOSIS — R0902 Hypoxemia: Secondary | ICD-10-CM | POA: Diagnosis not present

## 2018-06-24 DIAGNOSIS — J432 Centrilobular emphysema: Secondary | ICD-10-CM | POA: Diagnosis not present

## 2018-06-24 DIAGNOSIS — G4734 Idiopathic sleep related nonobstructive alveolar hypoventilation: Secondary | ICD-10-CM | POA: Insufficient documentation

## 2018-07-15 DIAGNOSIS — J449 Chronic obstructive pulmonary disease, unspecified: Secondary | ICD-10-CM | POA: Diagnosis not present

## 2018-07-15 DIAGNOSIS — R0902 Hypoxemia: Secondary | ICD-10-CM | POA: Diagnosis not present

## 2020-12-22 DIAGNOSIS — Z79899 Other long term (current) drug therapy: Secondary | ICD-10-CM | POA: Diagnosis not present

## 2020-12-22 DIAGNOSIS — Z13220 Encounter for screening for lipoid disorders: Secondary | ICD-10-CM | POA: Diagnosis not present

## 2020-12-22 DIAGNOSIS — J449 Chronic obstructive pulmonary disease, unspecified: Secondary | ICD-10-CM | POA: Diagnosis not present

## 2020-12-22 DIAGNOSIS — Z0000 Encounter for general adult medical examination without abnormal findings: Secondary | ICD-10-CM | POA: Diagnosis not present

## 2020-12-22 DIAGNOSIS — Z131 Encounter for screening for diabetes mellitus: Secondary | ICD-10-CM | POA: Diagnosis not present

## 2020-12-22 DIAGNOSIS — R531 Weakness: Secondary | ICD-10-CM | POA: Diagnosis not present

## 2021-02-02 DIAGNOSIS — Z131 Encounter for screening for diabetes mellitus: Secondary | ICD-10-CM | POA: Diagnosis not present

## 2021-02-02 DIAGNOSIS — Z13220 Encounter for screening for lipoid disorders: Secondary | ICD-10-CM | POA: Diagnosis not present

## 2021-02-02 DIAGNOSIS — Z79899 Other long term (current) drug therapy: Secondary | ICD-10-CM | POA: Diagnosis not present

## 2021-02-02 DIAGNOSIS — R531 Weakness: Secondary | ICD-10-CM | POA: Diagnosis not present

## 2021-02-02 DIAGNOSIS — Z0000 Encounter for general adult medical examination without abnormal findings: Secondary | ICD-10-CM | POA: Diagnosis not present

## 2021-02-07 DIAGNOSIS — J449 Chronic obstructive pulmonary disease, unspecified: Secondary | ICD-10-CM | POA: Diagnosis not present

## 2021-02-07 DIAGNOSIS — E559 Vitamin D deficiency, unspecified: Secondary | ICD-10-CM | POA: Diagnosis not present

## 2021-02-07 DIAGNOSIS — E785 Hyperlipidemia, unspecified: Secondary | ICD-10-CM | POA: Diagnosis not present

## 2021-06-26 DIAGNOSIS — E559 Vitamin D deficiency, unspecified: Secondary | ICD-10-CM | POA: Diagnosis not present

## 2021-06-26 DIAGNOSIS — E785 Hyperlipidemia, unspecified: Secondary | ICD-10-CM | POA: Diagnosis not present

## 2021-07-12 DIAGNOSIS — E559 Vitamin D deficiency, unspecified: Secondary | ICD-10-CM | POA: Diagnosis not present

## 2021-07-12 DIAGNOSIS — E785 Hyperlipidemia, unspecified: Secondary | ICD-10-CM | POA: Diagnosis not present

## 2021-07-12 DIAGNOSIS — M25521 Pain in right elbow: Secondary | ICD-10-CM | POA: Diagnosis not present

## 2021-07-12 DIAGNOSIS — S51001A Unspecified open wound of right elbow, initial encounter: Secondary | ICD-10-CM | POA: Diagnosis not present

## 2021-07-12 DIAGNOSIS — Z96621 Presence of right artificial elbow joint: Secondary | ICD-10-CM | POA: Diagnosis not present

## 2021-07-12 DIAGNOSIS — J449 Chronic obstructive pulmonary disease, unspecified: Secondary | ICD-10-CM | POA: Diagnosis not present

## 2021-08-29 ENCOUNTER — Other Ambulatory Visit: Payer: Self-pay

## 2021-08-29 ENCOUNTER — Encounter: Payer: Self-pay | Admitting: Orthopedic Surgery

## 2021-08-29 ENCOUNTER — Ambulatory Visit (INDEPENDENT_AMBULATORY_CARE_PROVIDER_SITE_OTHER): Payer: Medicare HMO | Admitting: Orthopedic Surgery

## 2021-08-29 ENCOUNTER — Ambulatory Visit: Payer: Medicare Other

## 2021-08-29 VITALS — BP 143/90 | HR 90 | Ht 64.0 in | Wt 109.0 lb

## 2021-08-29 DIAGNOSIS — M25521 Pain in right elbow: Secondary | ICD-10-CM

## 2021-08-29 DIAGNOSIS — T847XXA Infection and inflammatory reaction due to other internal orthopedic prosthetic devices, implants and grafts, initial encounter: Secondary | ICD-10-CM | POA: Diagnosis not present

## 2021-08-29 MED ORDER — SULFAMETHOXAZOLE-TRIMETHOPRIM 800-160 MG PO TABS: 1.0000 | ORAL_TABLET | Freq: Two times a day (BID) | ORAL | 0 refills | Status: AC

## 2021-08-29 NOTE — Patient Instructions (Signed)
Will plan for hardware removal  May require brief hospitalization and/or 6 weeks of IV antibiotics  Brief period of immobilization following surgery

## 2021-08-29 NOTE — Progress Notes (Signed)
New Patient Visit  Assessment: Tamara Michael is a 75 y.o. 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  Otherwise, she is stable.  Surgery was approximately 10 years ago.  She has had no issues in her recovery.  Due to the chronic drainage for the past couple of months, and the exposed hardware, I recommended hardware removal.  We will start her on antibiotics immediately to ensure that she does not become ill.  We will plan to proceed with surgery in approximately 1 week.  She may require extended period of IV antibiotics.  However, it is possible to treat this with p.o. antibiotics following hardware removal.  The surgery plan follow-up was discussed with the patient, and she is in agreement with this plan.  Risks and benefits of the surgery, including, but not limited to infection, bleeding, persistent pain, need for further surgery and more severe complications associated with anesthesia were discussed with the patient.  The patient has elected to proceed.  Planned date of surgery 09/07/2021  Surgical Plan  Procedure: Hardware removal from right olecranon Disposition: Ambulatory; possible inpatient admission. Anesthesia: General, possible block Medical Comorbidities: COPD, asthma Notes: We will try and determine the exact type of implant    Follow-up: Return for After surgery; 09/07/21.  Subjective:  Chief Complaint  Patient presents with   Elbow Pain    Pt with Rt elbow stinging and discharge for 2 months. Pt states she does have a rod in her arm after 2 fx approx 10 yrs ago.    History of Present Illness: Tamara Michael is a 75 y.o. RHD female who has been referred to clinic today by Marylynn Pearson, FNP for evaluation of right elbow pain.  Patient states that she had an ORIF of a right olecranon fracture approximately  10 years ago in [place].  She recovered without issue.  She has done well for many years.  Approximately 2-3 months ago, she started to have some drainage from her incision.  There has been some associated pain.  She denies fevers or chills.  She has felt well otherwise.  She reports that the drainage is clear she has not noted any purulence.  She continues to have excellent range of motion.  She has not tried any antibiotics.   Review of Systems: No fevers or chills No numbness or tingling No chest pain No shortness of breath No bowel or bladder dysfunction No GI distress No headaches   Medical History:  Past Medical History:  Diagnosis Date   Asthma    COPD (chronic obstructive pulmonary disease) (HCC)     Past Surgical History:  Procedure Laterality Date   arm surgery     CHOLECYSTECTOMY      History reviewed. No pertinent family history. Social History   Tobacco Use   Smoking status: Former  Substance Use Topics   Alcohol use: No   Drug use: No    Allergies  Allergen Reactions   Other Nausea And Vomiting    Anesthetics     Current Meds  Medication Sig   sulfamethoxazole-trimethoprim (BACTRIM DS) 800-160 MG tablet Take 1 tablet by mouth 2 (two) times daily for 14 days.    Objective: BP (!) 143/90   Pulse 90   Ht 5\' 4"  (1.626 m)   Wt 109 lb (49.4 kg)   BMI 18.71 kg/m  Physical Exam:  General: Elderly female., Alert and oriented., and No acute distress. Gait: Normal gait.  Evaluation of the right elbow demonstrates is some erythema over the olecranon.  There is a small, approximately 5 mm sinus track with exposed hardware directly underneath.  There is active drainage from the sinus tract.  Drainage is serous in appearance.  There is no purulence.  No foul smell.  Sensation is intact throughout the right hand.  2+ radial pulse.  She has near full extension, flexion beyond 130 degrees.  Full pronation and supination.  Painless range of  motion.  IMAGING: I personally ordered and reviewed the following images  X-rays of the right elbow were obtained in clinic today and demonstrates the presence of a plate and screw construct over the olecranon.  No acute injuries are noted.  There does appear to be direct communication between the hardware, and the overlying skin.  No swelling within the elbow joint.  Hardware remains intact.  No evidence of broken hardware.  Impression: Right elbow with sequelae of remote ORIF of the olecranon  New Medications:  Meds ordered this encounter  Medications   sulfamethoxazole-trimethoprim (BACTRIM DS) 800-160 MG tablet    Sig: Take 1 tablet by mouth 2 (two) times daily for 14 days.    Dispense:  28 tablet    Refill:  0      [name], MD  08/29/2021 11:02 PM

## 2021-08-29 NOTE — H&P (View-Only) (Signed)
New Patient Visit  Assessment: Tamara Michael is a 75 y.o. 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  Otherwise, she is stable.  Surgery was approximately 10 years ago.  She has had no issues in her recovery.  Due to the chronic drainage for the past couple of months, and the exposed hardware, I recommended hardware removal.  We will start her on antibiotics immediately to ensure that she does not become ill.  We will plan to proceed with surgery in approximately 1 week.  She may require extended period of IV antibiotics.  However, it is possible to treat this with p.o. antibiotics following hardware removal.  The surgery plan follow-up was discussed with the patient, and she is in agreement with this plan.  Risks and benefits of the surgery, including, but not limited to infection, bleeding, persistent pain, need for further surgery and more severe complications associated with anesthesia were discussed with the patient.  The patient has elected to proceed.  Planned date of surgery 09/07/2021  Surgical Plan  Procedure: Hardware removal from right olecranon Disposition: Ambulatory; possible inpatient admission. Anesthesia: General, possible block Medical Comorbidities: COPD, asthma Notes: We will try and determine the exact type of implant    Follow-up: Return for After surgery; 09/07/21.  Subjective:  Chief Complaint  Patient presents with   Elbow Pain    Pt with Rt elbow stinging and discharge for 2 months. Pt states she does have a rod in her arm after 2 fx approx 10 yrs ago.    History of Present Illness: Tamara Michael is a 75 y.o. RHD female who has been referred to clinic today by Marylynn Pearson, FNP for evaluation of right elbow pain.  Patient states that she had an ORIF of a right olecranon fracture approximately  10 years ago in [place].  She recovered without issue.  She has done well for many years.  Approximately 2-3 months ago, she started to have some drainage from her incision.  There has been some associated pain.  She denies fevers or chills.  She has felt well otherwise.  She reports that the drainage is clear she has not noted any purulence.  She continues to have excellent range of motion.  She has not tried any antibiotics.   Review of Systems: No fevers or chills No numbness or tingling No chest pain No shortness of breath No bowel or bladder dysfunction No GI distress No headaches   Medical History:  Past Medical History:  Diagnosis Date   Asthma    COPD (chronic obstructive pulmonary disease) (HCC)     Past Surgical History:  Procedure Laterality Date   arm surgery     CHOLECYSTECTOMY      History reviewed. No pertinent family history. Social History   Tobacco Use   Smoking status: Former  Substance Use Topics   Alcohol use: No   Drug use: No    Allergies  Allergen Reactions   Other Nausea And Vomiting    Anesthetics     Current Meds  Medication Sig   sulfamethoxazole-trimethoprim (BACTRIM DS) 800-160 MG tablet Take 1 tablet by mouth 2 (two) times daily for 14 days.    Objective: BP (!) 143/90   Pulse 90   Ht 5\' 4"  (1.626 m)   Wt 109 lb (49.4 kg)   BMI 18.71 kg/m  Physical Exam:  General: Elderly female., Alert and oriented., and No acute distress. Gait: Normal gait.  Evaluation of the right elbow demonstrates is some erythema over the olecranon.  There is a small, approximately 5 mm sinus track with exposed hardware directly underneath.  There is active drainage from the sinus tract.  Drainage is serous in appearance.  There is no purulence.  No foul smell.  Sensation is intact throughout the right hand.  2+ radial pulse.  She has near full extension, flexion beyond 130 degrees.  Full pronation and supination.  Painless range of  motion.  IMAGING: I personally ordered and reviewed the following images  X-rays of the right elbow were obtained in clinic today and demonstrates the presence of a plate and screw construct over the olecranon.  No acute injuries are noted.  There does appear to be direct communication between the hardware, and the overlying skin.  No swelling within the elbow joint.  Hardware remains intact.  No evidence of broken hardware.  Impression: Right elbow with sequelae of remote ORIF of the olecranon  New Medications:  Meds ordered this encounter  Medications   sulfamethoxazole-trimethoprim (BACTRIM DS) 800-160 MG tablet    Sig: Take 1 tablet by mouth 2 (two) times daily for 14 days.    Dispense:  28 tablet    Refill:  0      [name], MD  08/29/2021 11:02 PM

## 2021-08-31 NOTE — Patient Instructions (Signed)
Tamara Michael  08/31/2021     @PREFPERIOPPHARMACY @   Your procedure is scheduled on  09/07/2021.   Report to 09/09/2021 at  0820 A.M.   Call this number if you have problems the morning of surgery:  [phone]   Remember:  Do not eat or drink after midnight.     Use your nebulizer and your inhaler before you come and bring your rescue inhaler with you.      Take these medicines the morning of surgery with A SIP OF WATER                                           None     Do not wear jewelry, make-up or nail polish.  Do not wear lotions, powders, or perfumes, or deodorant.  Do not shave 48 hours prior to surgery.  Men may shave face and neck.  Do not bring valuables to the hospital.  [facility] is not responsible for any belongings or valuables.  Contacts, dentures or bridgework may not be worn into surgery.  Leave your suitcase in the car.  After surgery it may be brought to your room.  For patients admitted to the hospital, discharge time will be determined by your treatment team.  Patients discharged the day of surgery will not be allowed to drive home and must have someone with them for 24 hours.    Special instructions:   DO NOT smoke tobacco or vape for 24 hours.  Please read over the following fact sheets that you were given. Pain Booklet, Coughing and Deep Breathing, Surgical Site Infection Prevention, Anesthesia Post-op Instructions, and Care and Recovery After Surgery      Orthopedic Hardware Removal, Care After This sheet gives you information about how to care for yourself after your procedure. Your health care provider may also give you more specific instructions. If you have problems or questions, contact your health care provider. What can I expect after the procedure? After the procedure, it is common to have: Soreness or pain. Some swelling in the area where the hardware was removed. A small amount of blood or clear fluid coming from  your incision. Follow these instructions at home: If you have a cast: Do not stick anything inside the cast to scratch your skin. Doing that increases your risk of infection. Check the skin around the cast every day. Tell your health care provider about any concerns. You may put lotion on dry skin around the edges of the cast. Do not put lotion on the skin underneath the cast. Keep the cast clean and dry. If you have a splint or boot: Wear the splint or boot as told by your health care provider. Remove it only as told by your health care provider. Loosen the splint or boot if your fingers or toes tingle, become numb, or turn cold and blue. Keep the splint or boot clean and dry. Bathing Do not take baths, swim, or use a hot tub until your health care provider approves. Ask your health care provider if you may take showers. You may only be allowed to take sponge baths. Keep the bandage (dressing) dry until your health care provider says it can be removed. If your cast, splint, or boot is not waterproof: Do not let it get wet. Cover it with a watertight covering when you  take a bath or a shower. Incision care  Follow instructions from your health care provider about how to take care of your incision. Make sure you: Wash your hands with soap and water before you change your dressing. If soap and water are not available, use hand sanitizer. Change your dressing as told by your health care provider. Leave stitches (sutures), skin glue, or adhesive strips in place. These skin closures may need to stay in place for 2 weeks or longer. If adhesive strip edges start to loosen and curl up, you may trim the loose edges. Do not remove adhesive strips completely unless your health care provider tells you to do that. Check your incision area every day for signs of infection. Check for: Redness. More swelling or pain. More fluid or blood. Warmth. Pus or a bad smell. Managing pain, stiffness, and  swelling  If directed, put ice on the affected area: If you have a removable splint or boot, remove it as told by your health care provider. Put ice in a plastic bag. Place a towel between your skin and the bag. Leave the ice on for 20 minutes, 2-3 times a day. Move your fingers or toes often to avoid stiffness and to lessen swelling. Raise (elevate) the injured area above the level of your heart while you are sitting or lying down. Driving Do not drive or use heavy machinery while taking prescription pain medicine. Do not drive for 24 hours if you were given a medicine to help you relax (sedative) during your procedure. Ask your health care provider when it is safe to drive if you have a cast, splint, or boot on the affected limb. Activity Ask your health care provider what activities are safe for you during recovery, and ask what activities you need to avoid. Do not use the injured limb to support your body weight until your health care provider says that you can. Do not play contact sports until your health care provider approves. Do exercises as told by your health care provider. Avoid sitting for a long time without moving. Get up and move around at least every few hours. This will help prevent blood clots. General instructions Do not put pressure on any part of the cast or splint until it is fully hardened. This may take several hours. If you are taking prescription pain medicine, take actions to prevent or treat constipation. Your health care provider may recommend that you: Drink enough fluid to keep your urine pale yellow. Eat foods that are high in fiber, such as fresh fruits and vegetables, whole grains, and beans. Limit foods that are high in fat and processed sugars, such as fried or sweet foods. Take an over-the-counter or prescription medicine for constipation. Do not use any products that contain nicotine or tobacco, such as cigarettes and e-cigarettes. These can delay bone  healing after surgery. If you need help quitting, ask your health care provider. Take over-the-counter and prescription medicines only as told by your health care provider. Keep all follow-up visits as told by your health care provider. This is important. Contact a health care provider if: You have lasting pain. You have redness around your incision. You have more swelling or pain around your incision. You have more fluid or blood coming from your incision. Your incision feels warm to the touch. You have pus or a bad smell coming from your incision. You are unable to do exercises or physical activity as told by your health care provider. Get help  right away if: You have difficulty breathing. You have chest pain. You have severe pain. You have a fever or chills. You have numbness for more than 24 hours in the area where the hardware was removed. Summary After the procedure, it is common to have some pain and swelling in the area where the hardware was removed. Follow instructions from your health care provider about how to take care of your incision. Return to your normal activities as told by your health care provider. Ask your health care provider what activities are safe for you. This information is not intended to replace advice given to you by your health care provider. Make sure you discuss any questions you have with your health care provider. Document Revised: 02/17/2021 Document Reviewed: 02/17/2021 Elsevier Patient Education  2022 [employer] General Anesthesia, Adult, Care After This sheet gives you information about how to care for yourself after your procedure. Your health care provider may also give you more specific instructions. If you have problems or questions, contact your health care provider. What can I expect after the procedure? After the procedure, the following side effects are common: Pain or discomfort at the IV site. Nausea. Vomiting. Sore throat. Trouble  concentrating. Feeling cold or chills. Feeling weak or tired. Sleepiness and fatigue. Soreness and body aches. These side effects can affect parts of the body that were not involved in surgery. Follow these instructions at home: For the time period you were told by your health care provider:  Rest. Do not participate in activities where you could fall or become injured. Do not drive or use machinery. Do not drink alcohol. Do not take sleeping pills or medicines that cause drowsiness. Do not make important decisions or sign legal documents. Do not take care of children on your own. Eating and drinking Follow any instructions from your health care provider about eating or drinking restrictions. When you feel hungry, start by eating small amounts of foods that are soft and easy to digest (bland), such as toast. Gradually return to your regular diet. Drink enough fluid to keep your urine pale yellow. If you vomit, rehydrate by drinking water, juice, or clear broth. General instructions If you have sleep apnea, surgery and certain medicines can increase your risk for breathing problems. Follow instructions from your health care provider about wearing your sleep device: Anytime you are sleeping, including during daytime naps. While taking prescription pain medicines, sleeping medicines, or medicines that make you drowsy. Have a responsible adult stay with you for the time you are told. It is important to have someone help care for you until you are awake and alert. Return to your normal activities as told by your health care provider. Ask your health care provider what activities are safe for you. Take over-the-counter and prescription medicines only as told by your health care provider. If you smoke, do not smoke without supervision. Keep all follow-up visits as told by your health care provider. This is important. Contact a health care provider if: You have nausea or vomiting that does not  get better with medicine. You cannot eat or drink without vomiting. You have pain that does not get better with medicine. You are unable to pass urine. You develop a skin rash. You have a fever. You have redness around your IV site that gets worse. Get help right away if: You have difficulty breathing. You have chest pain. You have blood in your urine or stool, or you vomit blood. Summary After the procedure,  it is common to have a sore throat or nausea. It is also common to feel tired. Have a responsible adult stay with you for the time you are told. It is important to have someone help care for you until you are awake and alert. When you feel hungry, start by eating small amounts of foods that are soft and easy to digest (bland), such as toast. Gradually return to your regular diet. Drink enough fluid to keep your urine pale yellow. Return to your normal activities as told by your health care provider. Ask your health care provider what activities are safe for you. This information is not intended to replace advice given to you by your health care provider. Make sure you discuss any questions you have with your health care provider. Document Revised: 08/18/2020 Document Reviewed: 03/17/2020 Elsevier Patient Education  2022 [employer] How to Use Chlorhexidine for Bathing Chlorhexidine gluconate (CHG) is a germ-killing (antiseptic) solution that is used to clean the skin. It can get rid of the bacteria that normally live on the skin and can keep them away for about 24 hours. To clean your skin with CHG, you may be given: A CHG solution to use in the shower or as part of a sponge bath. A prepackaged cloth that contains CHG. Cleaning your skin with CHG may help lower the risk for infection: While you are staying in the intensive care unit of the hospital. If you have a vascular access, such as a central line, to provide short-term or long-term access to your veins. If you have a catheter  to drain urine from your bladder. If you are on a ventilator. A ventilator is a machine that helps you breathe by moving air in and out of your lungs. After surgery. What are the risks? Risks of using CHG include: A skin reaction. Hearing loss, if CHG gets in your ears and you have a perforated eardrum. Eye injury, if CHG gets in your eyes and is not rinsed out. The CHG product catching fire. Make sure that you avoid smoking and flames after applying CHG to your skin. Do not use CHG: If you have a chlorhexidine allergy or have previously reacted to chlorhexidine. On babies younger than 40 months of age. How to use CHG solution Use CHG only as told by your health care provider, and follow the instructions on the label. Use the full amount of CHG as directed. Usually, this is one bottle. During a shower Follow these steps when using CHG solution during a shower (unless your health care provider gives you different instructions): Start the shower. Use your normal soap and shampoo to wash your face and hair. Turn off the shower or move out of the shower stream. Pour the CHG onto a clean washcloth. Do not use any type of brush or rough-edged sponge. Starting at your neck, lather your body down to your toes. Make sure you follow these instructions: If you will be having surgery, pay special attention to the part of your body where you will be having surgery. Scrub this area for at least 1 minute. Do not use CHG on your head or face. If the solution gets into your ears or eyes, rinse them well with water. Avoid your genital area. Avoid any areas of skin that have broken skin, cuts, or scrapes. Scrub your back and under your arms. Make sure to wash skin folds. Let the lather sit on your skin for 1-2 minutes or as long as told by  your health care provider. Thoroughly rinse your entire body in the shower. Make sure that all body creases and crevices are rinsed well. Dry off with a clean towel. Do  not put any substances on your body afterward--such as powder, lotion, or perfume--unless you are told to do so by your health care provider. Only use lotions that are recommended by the manufacturer. Put on clean clothes or pajamas. If it is the night before your surgery, sleep in clean sheets.  During a sponge bath Follow these steps when using CHG solution during a sponge bath (unless your health care provider gives you different instructions): Use your normal soap and shampoo to wash your face and hair. Pour the CHG onto a clean washcloth. Starting at your neck, lather your body down to your toes. Make sure you follow these instructions: If you will be having surgery, pay special attention to the part of your body where you will be having surgery. Scrub this area for at least 1 minute. Do not use CHG on your head or face. If the solution gets into your ears or eyes, rinse them well with water. Avoid your genital area. Avoid any areas of skin that have broken skin, cuts, or scrapes. Scrub your back and under your arms. Make sure to wash skin folds. Let the lather sit on your skin for 1-2 minutes or as long as told by your health care provider. Using a different clean, wet washcloth, thoroughly rinse your entire body. Make sure that all body creases and crevices are rinsed well. Dry off with a clean towel. Do not put any substances on your body afterward--such as powder, lotion, or perfume--unless you are told to do so by your health care provider. Only use lotions that are recommended by the manufacturer. Put on clean clothes or pajamas. If it is the night before your surgery, sleep in clean sheets. How to use CHG prepackaged cloths Only use CHG cloths as told by your health care provider, and follow the instructions on the label. Use the CHG cloth on clean, dry skin. Do not use the CHG cloth on your head or face unless your health care provider tells you to. When washing with the CHG  cloth: Avoid your genital area. Avoid any areas of skin that have broken skin, cuts, or scrapes. Before surgery Follow these steps when using a CHG cloth to clean before surgery (unless your health care provider gives you different instructions): Using the CHG cloth, vigorously scrub the part of your body where you will be having surgery. Scrub using a back-and-forth motion for 3 minutes. The area on your body should be completely wet with CHG when you are done scrubbing. Do not rinse. Discard the cloth and let the area air-dry. Do not put any substances on the area afterward, such as powder, lotion, or perfume. Put on clean clothes or pajamas. If it is the night before your surgery, sleep in clean sheets.  For general bathing Follow these steps when using CHG cloths for general bathing (unless your health care provider gives you different instructions). Use a separate CHG cloth for each area of your body. Make sure you wash between any folds of skin and between your fingers and toes. Wash your body in the following order, switching to a new cloth after each step: The front of your neck, shoulders, and chest. Both of your arms, under your arms, and your hands. Your stomach and groin area, avoiding the genitals. Your right leg and  foot. Your left leg and foot. The back of your neck, your back, and your buttocks. Do not rinse. Discard the cloth and let the area air-dry. Do not put any substances on your body afterward--such as powder, lotion, or perfume--unless you are told to do so by your health care provider. Only use lotions that are recommended by the manufacturer. Put on clean clothes or pajamas. Contact a health care provider if: Your skin gets irritated after scrubbing. You have questions about using your solution or cloth. You swallow any chlorhexidine. Call your local poison control center [phone])840-7473 in the [place] Get help right away if: Your eyes itch badly, or they become  very red or swollen. Your skin itches badly and is red or swollen. Your hearing changes. You have trouble seeing. You have swelling or tingling in your mouth or throat. You have trouble breathing. These symptoms may represent a serious problem that is an emergency. Do not wait to see if the symptoms will go away. Get medical help right away. Call your local emergency services (911 in the [place] Do not drive yourself to the hospital. Summary Chlorhexidine gluconate (CHG) is a germ-killing (antiseptic) solution that is used to clean the skin. Cleaning your skin with CHG may help to lower your risk for infection. You may be given CHG to use for bathing. It may be in a bottle or in a prepackaged cloth to use on your skin. Carefully follow your health care provider's instructions and the instructions on the product label. Do not use CHG if you have a chlorhexidine allergy. Contact your health care provider if your skin gets irritated after scrubbing. This information is not intended to replace advice given to you by your health care provider. Make sure you discuss any questions you have with your health care provider. Document Revised: 02/13/2021 Document Reviewed: 02/13/2021 Elsevier Patient Education  2022 [employer].

## 2021-09-05 ENCOUNTER — Encounter (HOSPITAL_COMMUNITY)
Admission: RE | Admit: 2021-09-05 | Discharge: 2021-09-05 | Disposition: A | Discharge status: Home / Self Care | Payer: Medicare HMO | Source: Ambulatory Visit | Attending: Orthopedic Surgery | Admitting: Orthopedic Surgery

## 2021-09-05 ENCOUNTER — Other Ambulatory Visit: Payer: Self-pay

## 2021-09-05 ENCOUNTER — Encounter (HOSPITAL_COMMUNITY): Payer: Self-pay

## 2021-09-05 DIAGNOSIS — Z01818 Encounter for other preprocedural examination: Secondary | ICD-10-CM | POA: Diagnosis not present

## 2021-09-05 HISTORY — DX: Other specified postprocedural states: Z98.890

## 2021-09-05 HISTORY — DX: Other specified postprocedural states: R11.2

## 2021-09-05 LAB — BASIC METABOLIC PANEL
Anion gap: 7 (ref 5–15)
BUN: 12 mg/dL (ref 8–23)
CO2: 23 mmol/L (ref 22–32)
Calcium: 8.7 mg/dL — ABNORMAL LOW (ref 8.9–10.3)
Chloride: 102 mmol/L (ref 98–111)
Creatinine, Ser: 0.94 mg/dL (ref 0.44–1.00)
GFR, Estimated: >60 mL/min (ref >60)
Glucose, Bld: 98 mg/dL (ref 70–99)
Potassium: 4.1 mmol/L (ref 3.5–5.1)
Sodium: 132 mmol/L — ABNORMAL LOW (ref 135–145)

## 2021-09-05 LAB — CBC
HCT: 47.9 % — ABNORMAL HIGH (ref 36.0–46.0)
Hemoglobin: 14.9 g/dL (ref 12.0–15.0)
MCH: 28.8 pg (ref 26.0–34.0)
MCHC: 31.1 g/dL (ref 30.0–36.0)
MCV: 92.5 fL (ref 80.0–100.0)
Platelets: 284 10*3/uL (ref 150–400)
RBC: 5.18 MIL/uL — ABNORMAL HIGH (ref 3.87–5.11)
RDW: 13.2 % (ref 11.5–15.5)
WBC: 2.1 10*3/uL — ABNORMAL LOW (ref 4.0–10.5)
nRBC: 0 % (ref 0.0–0.2)

## 2021-09-05 LAB — SEDIMENTATION RATE: Sed Rate: 16 mm/hr (ref 0–22)

## 2021-09-05 LAB — C-REACTIVE PROTEIN: CRP: 0.6 mg/dL (ref <1.0)

## 2021-09-06 NOTE — Progress Notes (Signed)
Dr Dallas Schimke and Dr Johnnette Litter notified WBC's 2.1.  No orders given.

## 2021-09-07 ENCOUNTER — Other Ambulatory Visit: Payer: Self-pay

## 2021-09-07 ENCOUNTER — Encounter (HOSPITAL_COMMUNITY)
Admission: RE | Disposition: A | Discharge status: Home / Self Care | Payer: Self-pay | Source: Ambulatory Visit | Attending: Orthopedic Surgery

## 2021-09-07 ENCOUNTER — Ambulatory Visit (HOSPITAL_COMMUNITY): Payer: Medicare HMO

## 2021-09-07 ENCOUNTER — Ambulatory Visit (HOSPITAL_COMMUNITY): Payer: Medicare HMO | Admitting: Certified Registered"

## 2021-09-07 ENCOUNTER — Ambulatory Visit (HOSPITAL_COMMUNITY)
Admission: RE | Admit: 2021-09-07 | Discharge: 2021-09-07 | Disposition: A | Discharge status: Home / Self Care | Payer: Medicare HMO | Source: Ambulatory Visit | Attending: Orthopedic Surgery | Admitting: Orthopedic Surgery

## 2021-09-07 ENCOUNTER — Encounter (HOSPITAL_COMMUNITY): Payer: Self-pay | Admitting: Orthopedic Surgery

## 2021-09-07 DIAGNOSIS — Z472 Encounter for removal of internal fixation device: Secondary | ICD-10-CM | POA: Diagnosis not present

## 2021-09-07 DIAGNOSIS — Y798 Miscellaneous orthopedic devices associated with adverse incidents, not elsewhere classified: Secondary | ICD-10-CM | POA: Insufficient documentation

## 2021-09-07 DIAGNOSIS — Z884 Allergy status to anesthetic agent status: Secondary | ICD-10-CM | POA: Insufficient documentation

## 2021-09-07 DIAGNOSIS — T84619A Infection and inflammatory reaction due to internal fixation device of unspecified bone of arm, initial encounter: Secondary | ICD-10-CM | POA: Insufficient documentation

## 2021-09-07 DIAGNOSIS — J449 Chronic obstructive pulmonary disease, unspecified: Secondary | ICD-10-CM | POA: Diagnosis not present

## 2021-09-07 DIAGNOSIS — M869 Osteomyelitis, unspecified: Secondary | ICD-10-CM | POA: Diagnosis not present

## 2021-09-07 DIAGNOSIS — Z9889 Other specified postprocedural states: Secondary | ICD-10-CM | POA: Diagnosis not present

## 2021-09-07 DIAGNOSIS — Z87891 Personal history of nicotine dependence: Secondary | ICD-10-CM | POA: Diagnosis not present

## 2021-09-07 DIAGNOSIS — G8918 Other acute postprocedural pain: Secondary | ICD-10-CM | POA: Diagnosis not present

## 2021-09-07 DIAGNOSIS — M86131 Other acute osteomyelitis, right radius and ulna: Secondary | ICD-10-CM | POA: Diagnosis not present

## 2021-09-07 DIAGNOSIS — T84614A Infection and inflammatory reaction due to internal fixation device of right ulna, initial encounter: Secondary | ICD-10-CM | POA: Diagnosis not present

## 2021-09-07 HISTORY — PX: HARDWARE REMOVAL: SHX979

## 2021-09-07 HISTORY — PX: APPLICATION OF WOUND VAC: SHX5189

## 2021-09-07 SURGERY — REMOVAL, HARDWARE
Anesthesia: General | Site: Elbow | Laterality: Right

## 2021-09-07 MED ORDER — PROPOFOL 10 MG/ML IV BOLUS
INTRAVENOUS | Status: DC | PRN
  Administered 2021-09-07: 100 mg via INTRAVENOUS

## 2021-09-07 MED ORDER — ROCURONIUM BROMIDE 10 MG/ML (PF) SYRINGE
PREFILLED_SYRINGE | INTRAVENOUS | Status: AC
  Filled 2021-09-07: qty 10

## 2021-09-07 MED ORDER — DEXMEDETOMIDINE (PRECEDEX) IN NS 20 MCG/5ML (4 MCG/ML) IV SYRINGE
PREFILLED_SYRINGE | INTRAVENOUS | Status: AC
  Filled 2021-09-07: qty 5

## 2021-09-07 MED ORDER — SULFAMETHOXAZOLE-TRIMETHOPRIM 800-160 MG PO TABS: 1.0000 | ORAL_TABLET | Freq: Two times a day (BID) | ORAL | 0 refills | Status: AC

## 2021-09-07 MED ORDER — DEXAMETHASONE SODIUM PHOSPHATE 10 MG/ML IJ SOLN
INTRAMUSCULAR | Status: AC
  Filled 2021-09-07: qty 1

## 2021-09-07 MED ORDER — FENTANYL CITRATE (PF) 100 MCG/2ML IJ SOLN
INTRAMUSCULAR | Status: AC
  Filled 2021-09-07: qty 2

## 2021-09-07 MED ORDER — DEXAMETHASONE SODIUM PHOSPHATE 4 MG/ML IJ SOLN
INTRAMUSCULAR | Status: DC | PRN
  Administered 2021-09-07: 4 mg via INTRAVENOUS

## 2021-09-07 MED ORDER — VANCOMYCIN HCL 1000 MG IV SOLR
INTRAVENOUS | Status: AC
  Filled 2021-09-07: qty 20

## 2021-09-07 MED ORDER — SODIUM CHLORIDE 0.9 % IR SOLN
Status: DC | PRN
  Administered 2021-09-07: 3000 mL

## 2021-09-07 MED ORDER — CEFAZOLIN SODIUM-DEXTROSE 2-4 GM/100ML-% IV SOLN: 2.0000 g | INTRAVENOUS | Status: DC

## 2021-09-07 MED ORDER — HYDROCODONE-ACETAMINOPHEN 5-325 MG PO TABS: 1.0000 | ORAL_TABLET | ORAL | 0 refills | Status: AC | PRN

## 2021-09-07 MED ORDER — MIDAZOLAM HCL 2 MG/2ML IJ SOLN
INTRAMUSCULAR | Status: AC
  Filled 2021-09-07: qty 2

## 2021-09-07 MED ORDER — ROCURONIUM BROMIDE 100 MG/10ML IV SOLN
INTRAVENOUS | Status: DC | PRN
  Administered 2021-09-07: 60 mg via INTRAVENOUS

## 2021-09-07 MED ORDER — PROPOFOL 10 MG/ML IV BOLUS
INTRAVENOUS | Status: AC
  Filled 2021-09-07: qty 20

## 2021-09-07 MED ORDER — DEXMEDETOMIDINE (PRECEDEX) IN NS 20 MCG/5ML (4 MCG/ML) IV SYRINGE
PREFILLED_SYRINGE | INTRAVENOUS | Status: DC | PRN
  Administered 2021-09-07: 20 ug via INTRAVENOUS

## 2021-09-07 MED ORDER — 0.9 % SODIUM CHLORIDE (POUR BTL) OPTIME
TOPICAL | Status: DC | PRN
  Administered 2021-09-07: 1000 mL

## 2021-09-07 MED ORDER — ONDANSETRON HCL 4 MG/2ML IJ SOLN: 4.0000 mg | Freq: Once | INTRAMUSCULAR | Status: DC | PRN

## 2021-09-07 MED ORDER — ASPIRIN EC 81 MG PO TBEC: 81.0000 mg | DELAYED_RELEASE_TABLET | Freq: Two times a day (BID) | ORAL | 0 refills | Status: AC

## 2021-09-07 MED ORDER — ONDANSETRON HCL 4 MG/2ML IJ SOLN
INTRAMUSCULAR | Status: AC
  Filled 2021-09-07: qty 2

## 2021-09-07 MED ORDER — LIDOCAINE HCL (PF) 2 % IJ SOLN
INTRAMUSCULAR | Status: AC
  Filled 2021-09-07: qty 5

## 2021-09-07 MED ORDER — FENTANYL CITRATE (PF) 100 MCG/2ML IJ SOLN
INTRAMUSCULAR | Status: DC | PRN
  Administered 2021-09-07: 25 ug via INTRAVENOUS
  Administered 2021-09-07: 75 ug via INTRAVENOUS

## 2021-09-07 MED ORDER — CEFAZOLIN SODIUM-DEXTROSE 2-4 GM/100ML-% IV SOLN
INTRAVENOUS | Status: AC
  Filled 2021-09-07: qty 100

## 2021-09-07 MED ORDER — ONDANSETRON HCL 4 MG PO TABS: 4.0000 mg | ORAL_TABLET | Freq: Three times a day (TID) | ORAL | 0 refills | Status: AC | PRN

## 2021-09-07 MED ORDER — ONDANSETRON HCL 4 MG/2ML IJ SOLN
INTRAMUSCULAR | Status: DC | PRN
  Administered 2021-09-07: 4 mg via INTRAVENOUS

## 2021-09-07 MED ORDER — LIDOCAINE HCL (CARDIAC) PF 100 MG/5ML IV SOSY
PREFILLED_SYRINGE | INTRAVENOUS | Status: DC | PRN
  Administered 2021-09-07: 1 mL via INTRAVENOUS

## 2021-09-07 MED ORDER — CEFAZOLIN SODIUM-DEXTROSE 2-3 GM-%(50ML) IV SOLR
INTRAVENOUS | Status: DC | PRN
  Administered 2021-09-07: 2 g via INTRAVENOUS

## 2021-09-07 MED ORDER — FENTANYL CITRATE PF 50 MCG/ML IJ SOSY: 25.0000 ug | PREFILLED_SYRINGE | INTRAMUSCULAR | Status: DC | PRN

## 2021-09-07 MED ORDER — VANCOMYCIN HCL 1000 MG IV SOLR
INTRAVENOUS | Status: DC | PRN
  Administered 2021-09-07: 1000 mg

## 2021-09-07 MED ORDER — LACTATED RINGERS IV SOLN
INTRAVENOUS | Status: DC | PRN
Start: 2021-09-07 — End: 2021-09-07

## 2021-09-07 MED ORDER — SUGAMMADEX SODIUM 200 MG/2ML IV SOLN
INTRAVENOUS | Status: DC | PRN
  Administered 2021-09-07: 200 mg via INTRAVENOUS

## 2021-09-07 MED ORDER — MIDAZOLAM HCL 5 MG/5ML IJ SOLN
INTRAMUSCULAR | Status: DC | PRN
  Administered 2021-09-07: 2 mg via INTRAVENOUS

## 2021-09-07 MED ORDER — ROPIVACAINE HCL 5 MG/ML IJ SOLN
INTRAMUSCULAR | Status: DC | PRN
  Administered 2021-09-07 (×2): 15 mL via EPIDURAL

## 2021-09-07 SURGICAL SUPPLY — 41 items
APL PRP STRL LF DISP 70% ISPRP (MISCELLANEOUS)
BANDAGE ELASTIC 4 VELCRO NS (GAUZE/BANDAGES/DRESSINGS) ×2 IMPLANT
BANDAGE ESMARK 4X12 BL STRL LF (DISPOSABLE) ×1 IMPLANT
BLADE SURG SZ10 CARB STEEL (BLADE) ×2 IMPLANT
BNDG CMPR 12X4 ELC STRL LF (DISPOSABLE) ×1
BNDG CMPR STD VLCR NS LF 5.8X2 (GAUZE/BANDAGES/DRESSINGS) ×1
BNDG COHESIVE 4X5 TAN STRL (GAUZE/BANDAGES/DRESSINGS) ×2 IMPLANT
BNDG ELASTIC 2X5.8 VLCR NS LF (GAUZE/BANDAGES/DRESSINGS) ×2 IMPLANT
BNDG ESMARK 4X12 BLUE STRL LF (DISPOSABLE) ×2
CHLORAPREP W/TINT 26 (MISCELLANEOUS) IMPLANT
CLOSURE STERI STRIP 1/2 X4 (GAUZE/BANDAGES/DRESSINGS) ×2 IMPLANT
CLOTH BEACON ORANGE TIMEOUT ST (SAFETY) ×2 IMPLANT
COVER LIGHT HANDLE STERIS (MISCELLANEOUS) ×2 IMPLANT
DRAPE U-SHAPE 47X51 STRL (DRAPES) ×2 IMPLANT
DRESSING PEEL AND PLC PRVNA 13 (GAUZE/BANDAGES/DRESSINGS) ×1 IMPLANT
DRSG PEEL AND PLACE PREVENA 13 (GAUZE/BANDAGES/DRESSINGS) ×2
ELECT REM PT RETURN 9FT ADLT (ELECTROSURGICAL) ×2
ELECTRODE REM PT RTRN 9FT ADLT (ELECTROSURGICAL) ×1 IMPLANT
GAUZE SPONGE 4X4 12PLY STRL (GAUZE/BANDAGES/DRESSINGS) ×2 IMPLANT
GLOVE SURG POLYISO LF SZ8 (GLOVE) ×4 IMPLANT
GOWN STRL REUS W/TWL LRG LVL3 (GOWN DISPOSABLE) ×4 IMPLANT
GOWN STRL REUS W/TWL XL LVL3 (GOWN DISPOSABLE) ×2 IMPLANT
INST SET MINOR BONE (KITS) ×2 IMPLANT
KIT PUMP PREVENA PLUS 14DAY (MISCELLANEOUS) ×2 IMPLANT
KIT TURNOVER KIT A (KITS) ×2 IMPLANT
MANIFOLD NEPTUNE II (INSTRUMENTS) ×2 IMPLANT
NS IRRIG 1000ML POUR BTL (IV SOLUTION) ×2 IMPLANT
PACK BASIC LIMB (CUSTOM PROCEDURE TRAY) ×2 IMPLANT
PAD ARMBOARD 7.5X6 YLW CONV (MISCELLANEOUS) ×4 IMPLANT
PAD CAST 4YDX4 CTTN HI CHSV (CAST SUPPLIES) ×2 IMPLANT
PAD TELFA 3X4 1S STER (GAUZE/BANDAGES/DRESSINGS) ×2 IMPLANT
PADDING CAST COTTON 4X4 STRL (CAST SUPPLIES) ×4
SET BASIN LINEN APH (SET/KITS/TRAYS/PACK) ×2 IMPLANT
SPONGE T-LAP 18X18 ~~LOC~~+RFID (SPONGE) ×2 IMPLANT
STRIP CLOSURE SKIN 1/2X4 (GAUZE/BANDAGES/DRESSINGS) ×2 IMPLANT
SUT ETHILON 3 0 FSL (SUTURE) ×2 IMPLANT
SUT MNCRL AB 4-0 PS2 18 (SUTURE) ×2 IMPLANT
SUT MON AB 2-0 SH 27 (SUTURE) ×2
SUT MON AB 2-0 SH27 (SUTURE) ×1 IMPLANT
SYR 20ML LL LF (SYRINGE) ×2 IMPLANT
SYR BULB IRRIG 60ML STRL (SYRINGE) ×2 IMPLANT

## 2021-09-07 NOTE — OR Nursing (Signed)
Dr. Dallas Schimke removed 7 screws and 1 plate from patient's right elbow.

## 2021-09-07 NOTE — Transfer of Care (Signed)
Immediate Anesthesia Transfer of Care Note  Patient: Tamara Michael  Procedure(s) Performed: HARDWARE REMOVAL (Right)  Patient Location: PACU  Anesthesia Type:General  Level of Consciousness: awake, alert , oriented, sedated and patient cooperative  Airway & Oxygen Therapy: Patient Spontanous Breathing and Patient connected to nasal cannula oxygen  Post-op Assessment: Report given to RN, Post -op Vital signs reviewed and stable and Patient moving all extremities X 4  Post vital signs: Reviewed and stable  Last Vitals:  Vitals Value Taken Time  BP    Temp    Pulse 88 09/07/21 1151  Resp 21 09/07/21 1151  SpO2 100 % 09/07/21 1151  Vitals shown include unvalidated device data.  Last Pain:  Vitals:   09/07/21 0912  TempSrc: Oral      Patients Stated Pain Goal: 8 (09/07/21 0912)  Complications: No notable events documented.

## 2021-09-07 NOTE — Anesthesia Preprocedure Evaluation (Signed)
Anesthesia Evaluation  Patient identified by MRN, date of birth, ID band Patient awake    Reviewed: Allergy & Precautions, H&P , NPO status , Patient's Chart, lab work & pertinent test results, reviewed documented beta blocker date and time   History of Anesthesia Complications (+) PONV and history of anesthetic complications  Airway Mallampati: II  TM Distance: >3 FB Neck ROM: full    Dental no notable dental hx.    Pulmonary asthma , COPD, former smoker,    Pulmonary exam normal breath sounds clear to auscultation       Cardiovascular Exercise Tolerance: Good negative cardio ROS   Rhythm:regular Rate:Normal     Neuro/Psych negative neurological ROS  negative psych ROS   GI/Hepatic negative GI ROS, Neg liver ROS,   Endo/Other  negative endocrine ROS  Renal/GU negative Renal ROS  negative genitourinary   Musculoskeletal   Abdominal   Peds  Hematology negative hematology ROS (+)   Anesthesia Other Findings   Reproductive/Obstetrics negative OB ROS                             Anesthesia Physical Anesthesia Plan  ASA: 3  Anesthesia Plan: General   Post-op Pain Management:  [facility] for Post-op pain and GA combined w/ [facility] for post-op pain   Induction:   PONV Risk Score and Plan: Ondansetron  Airway Management Planned:   Additional Equipment:   Intra-op Plan:   Post-operative Plan:   Informed Consent: I have reviewed the patients History and Physical, chart, labs and discussed the procedure including the risks, benefits and alternatives for the proposed anesthesia with the patient or authorized representative who has indicated his/her understanding and acceptance.     Dental Advisory Given  Plan Discussed with: CRNA  Anesthesia Plan Comments:         Anesthesia Quick Evaluation

## 2021-09-07 NOTE — Anesthesia Postprocedure Evaluation (Signed)
Anesthesia Post Note  Patient: CHARMEKA FREEBURG  Procedure(s) Performed: HARDWARE REMOVAL, ONE PLATE, SEVEN SCREWS FROM RIGHT ELBOW (Right: Elbow) APPLICATION OF WOUND VAC ON RIGHT ELBOW (Right: Elbow)  Patient location during evaluation: Phase II Anesthesia Type: General Level of consciousness: awake Pain management: pain level controlled Vital Signs Assessment: post-procedure vital signs reviewed and stable Respiratory status: spontaneous breathing and respiratory function stable Cardiovascular status: blood pressure returned to baseline and stable Postop Assessment: no headache and no apparent nausea or vomiting Anesthetic complications: no Comments: Late entry   No notable events documented.   Last Vitals:  Vitals:   09/07/21 1215 09/07/21 1227  BP: (!) 106/56 132/85  Pulse: 82   Resp: 18 16  Temp:  (!) 36.4 C  SpO2: 91% 94%    Last Pain:  Vitals:   09/07/21 1227  TempSrc: Axillary  PainSc: 0-No pain                 Windell Norfolk

## 2021-09-07 NOTE — Discharge Instructions (Signed)
Tamara Sharpnack A. Dallas Schimke, MD MS [facility] [address] [place],  [place]  [address] Phone: [phone] Fax: [phone]   POST-OPERATIVE INSTRUCTIONS - UPPER EXTREMITY   WOUND CARE Please keep splint clean dry and intact until followup.  You may shower on Post-Op Day #2.  You must keep splint dry during this process and may find that a plastic bag taped around the arm or alternatively a towel based bath may be a better option.   If you get your splint wet or if it is damaged please contact our clinic. You have pressure wound vac dressing in place.  You may see some drainage in the tubing and/or canister, this is normal.  You have an instruction manual with you, but it the suction dressing is malfunctioning, please contact the clinic and we will schedule an earlier appointment.   EXERCISES Due to your splint being in place you will not be able to bear weight through your extremity.   DO NOT PUT ANY WEIGHT ON YOUR OPERATIVE ARM It is ok to work on gentle range of motion, moving your elbow and exposed fingers   REGIONAL ANESTHESIA (NERVE BLOCKS) The anesthesia team may have performed a nerve block for you if safe in the setting of your care.  This is a great tool used to minimize pain.  Typically the block may start wearing off overnight but the long acting medicine may last for 3-4 days.  The nerve block wearing off can be a challenging period but please utilize your as needed pain medications to try and manage this period.    POST-OP MEDICATIONS- Multimodal approach to pain control  In general your pain will be controlled with a combination of substances.  Prescriptions unless otherwise discussed are electronically sent to your pharmacy.  This is a carefully made plan we use to minimize narcotic use.     - Celebrex - Anti-inflammatory medication taken on a scheduled basis  - Acetaminophen - Non-narcotic pain medicine taken on a scheduled basis   - Oxycodone -  This is a strong narcotic, to be used only on an "as needed" basis for pain.  -  Aspirin 81mg  - This medicine is used to minimize the risk of blood clots after surgery.             -          Zofran - take as needed for nausea   FOLLOW-UP If you develop a Fever (>101.5), Redness or Drainage from the surgical incision site, please call our office to arrange for an evaluation. Please call the office to schedule a follow-up appointment for your incision check if you do not already have one, 10-14 days post-operatively.  IF YOU HAVE ANY QUESTIONS, PLEASE FEEL FREE TO CALL OUR OFFICE.  HELPFUL INFORMATION  If you had a block, it will wear off between 8-24 hrs postop typically.  This is period when your pain may go from nearly zero to the pain you would have had postop without the block.  This is an abrupt transition but nothing dangerous is happening.  You may take an extra dose of narcotic when this happens.  You should wean off your narcotic medicines as soon as you are able.  Most patients will be off or using minimal narcotics before their first postop appointment.   Elevating your hand will help with swelling and pain control.  You are encouraged to elevate your hand as much as possible in the first  couple of weeks following surgery.  Imagine a drop of water on your finger, and your goal is to get that water back to your heart.  We suggest you use the pain medication the first night prior to going to bed, in order to ease any pain when the anesthesia wears off. You should avoid taking pain medications on an empty stomach as it will make you nauseous.  Do not drink alcoholic beverages or take illicit drugs when taking pain medications.  In most states it is against the law to drive while you are in a splint or sling.  And certainly against the law to drive while taking narcotics.  You may return to work/school in the next couple of days when you feel up to it.   Pain medication may make you  constipated.  Below are a few solutions to try in this order: Decrease the amount of pain medication if you aren't having pain. Drink lots of decaffeinated fluids. Drink prune juice and/or each dried prunes  If the first 3 don't work start with additional solutions Take Colace - an over-the-counter stool softener Take Senokot - an over-the-counter laxative Take Miralax - a stronger over-the-counter laxative

## 2021-09-07 NOTE — Progress Notes (Signed)
Dr. Dallas Schimke returned the call to discuss the wound vac.  Dr. Dallas Schimke stated that he would have his office call the patient to discuss the wound vac.

## 2021-09-07 NOTE — Interval H&P Note (Signed)
History and Physical Interval Note:  09/07/2021 9:46 AM  Tamara Michael  has presented today for surgery, with the diagnosis of Infected hardware right elbow.  The various methods of treatment have been discussed with the patient and family. After consideration of risks, benefits and other options for treatment, the patient has consented to  Procedure(s) with comments: HARDWARE REMOVAL (Right) - Hardware removal from right olecranon as a surgical intervention.  The patient's history has been reviewed, patient examined, no change in status, stable for surgery.  I have reviewed the patient's chart and labs.  Questions were answered to the patient's satisfaction.    Patient has exposed hardware on her right olecranon from surgery >10 years ago.  She had drainage from the sinus tract, and a second sinus tract is developing.  She tolerated the Bactrim.  She denies fevers and chills.  All questions were answered.  She is ready for surgery.    [name]

## 2021-09-07 NOTE — Anesthesia Procedure Notes (Signed)
Anesthesia Regional Block: Supraclavicular block   Pre-Anesthetic Checklist: , timeout performed,  Correct Patient, Correct Site, Correct Laterality,  Correct Procedure, Correct Position, site marked,  Risks and benefits discussed,  Surgical consent,  Pre-op evaluation,  At surgeon's request and post-op pain management  Laterality: Right  Prep: chloraprep       Needles:  Injection technique: Single-shot  Needle Type: Stimiplex     Needle Length: 10cm  Needle Gauge: 22     Additional Needles:   Procedures:,,,, ultrasound used (permanent image in chart),,    Narrative:  Start time: 09/07/2021 9:46 AM End time: 09/07/2021 9:50 AM Injection made incrementally with aspirations every 5 mL.  Performed by: With CRNAs  Anesthesiologist: Windell Norfolk, MD CRNA: Brynda Peon, CRNA

## 2021-09-07 NOTE — Op Note (Addendum)
Orthopaedic Surgery Operative Note (CSN: [id number])  Tamara Michael  09/26/1946 Date of Surgery: 09/07/2021   Diagnoses:  Infected hardware right elbow  Procedure: Removal of hardware from right olecranon   Operative Finding Successful completion of the planned procedure.  Plate and 7 screws were removed without issue.  The sinus tract in the skin was ellipsed and easily closed.  There was no purulent material encountered.  Some soft tissue around the plate, and in the empty holes of the plate was debrided and sent for culture.   Post-Op Diagnosis: Same Surgeons:Primary: [name], MD Location: [facility] OR ROOM 4 Anesthesia: General Antibiotics: Ancef 2 g with local vancomycin powder 1 g at the surgical site Tourniquet time:  Total Tourniquet Time Documented: Upper Arm (Right) - 47 minutes Total: Upper Arm (Right) - 47 minutes Estimated Blood Loss: Minimal Complications: None Specimens: None Implants: * No implants in log *  Indications for Surgery:   Tamara Michael is a 75 y.o. female who underwent right olecranon ORIF approximately 10 years ago, and started to develop a draining wound and exposed hardware 3-4 months ago.  As a result, I recommended removal of the hardware. She was started on PO antibiotics and presents today for surgery.  Benefits and risks of operative and nonoperative management were discussed prior to surgery with patient/guardian(s) and informed consent form was completed.  Specific risks including infection, need for additional surgery, fracture, persistent infection, pain in the elbow and incomplete removal of the hardware as well as more severe complications associated with anesthesia were discussed.  She elected to proceed.  Surgical consent was finalized.    Procedure:   The patient was identified properly. Informed consent was obtained and the surgical site was marked. The patient was taken to the OR where general anesthesia was induced.  The  patient was positioned lateral, on a beanbag.  The right arm was prepped and draped in the usual sterile fashion.  Timeout was performed before the beginning of the case.  Tourniquet was used for the above duration.  She received 2 g of Ancef prior to making incision.   The previous incision was identified.  The chronic sinus tract was identified, as well a developing sinus tract were outlined.  We used the prior surgical incision and went sharply through skin.  All underlying tissue was viable.  There was fresh, bleeding soft tissue.  No purulent material was encountered.  We continued with blunt dissection and used electrocautery to expose the plate.  All screws were exposed.  We selected a hex screwdriver and sequentially removed all screws.  Next, we used a freer elevator to remove the plate.  There were no issues.  Fluoroscopy confirmed complete removal of the hardware.   We then used a rongeur to debride some of the underlying necrotic appearing, soft tissue and sent this for culture.  There was a small amount of sclerotic bony overgrowth surrounding the plate, and the prominent bone was debrided.  We then used a key elevator to debride residual slough and any remaining prominent bone.  The wound was irrigated with a pulse-a-vac.  We then ellipsed the skin around the draining sinus tracts.  The skin was gently undermined to provide sufficient excursion for wound closure.   We then closed the incision in a multilayer fashion with absorbable suture.  We then placed a prevena portable wound vac and a well padded splint. Patient was awoken taken to PACU in stable condition.   Post-operative plan:  The patient will be NWB on the RUE. She will discharged from the PACU once stable Prevena wound vac in place until follow up    DVT prophylaxis Aspirin 81 mg twice daily for 2 weeks.    Pain control with PRN pain medication preferring oral medicines.   Another 2 week course of Bactrim; will change based on  culture results.  Follow up plan will be scheduled in approximately 10 days for incision check and XR.

## 2021-09-07 NOTE — OR Nursing (Signed)
Called Dr. Dallas Schimke to inquire if the patient needs to return to the office for wound vac care no call back received no answer at office.  Instructed patient to call tomorrow and ask while she is making her appointment in 10-14 days.

## 2021-09-07 NOTE — Anesthesia Procedure Notes (Signed)
Procedure Name: Intubation Date/Time: 09/07/2021 10:20 AM Performed by: Brynda Peon, CRNA Pre-anesthesia Checklist: Patient identified, Emergency Drugs available, Suction available, Patient being monitored and Timeout performed Patient Re-evaluated:Patient Re-evaluated prior to induction Oxygen Delivery Method: Circle system utilized Preoxygenation: Pre-oxygenation with 100% oxygen Induction Type: IV induction Laryngoscope Size: Miller and 2 Grade View: Grade I Tube type: Oral Tube size: 7.0 mm Number of attempts: 1 Airway Equipment and Method: Stylet Placement Confirmation: ETT inserted through vocal cords under direct vision, positive ETCO2, CO2 detector and breath sounds checked- equal and bilateral Secured at: 21 cm Tube secured with: Tape Dental Injury: Teeth and Oropharynx as per pre-operative assessment

## 2021-09-08 ENCOUNTER — Encounter (HOSPITAL_COMMUNITY): Payer: Self-pay | Admitting: Orthopedic Surgery

## 2021-09-08 LAB — SURGICAL PATHOLOGY

## 2021-09-15 ENCOUNTER — Ambulatory Visit: Payer: Medicare HMO

## 2021-09-15 ENCOUNTER — Encounter: Payer: Self-pay | Admitting: Orthopedic Surgery

## 2021-09-15 ENCOUNTER — Other Ambulatory Visit: Payer: Self-pay

## 2021-09-15 ENCOUNTER — Ambulatory Visit (INDEPENDENT_AMBULATORY_CARE_PROVIDER_SITE_OTHER): Payer: Medicare HMO | Admitting: Orthopedic Surgery

## 2021-09-15 DIAGNOSIS — T847XXD Infection and inflammatory reaction due to other internal orthopedic prosthetic devices, implants and grafts, subsequent encounter: Secondary | ICD-10-CM | POA: Diagnosis not present

## 2021-09-15 DIAGNOSIS — Z9889 Other specified postprocedural states: Secondary | ICD-10-CM

## 2021-09-15 MED ORDER — ONDANSETRON HCL 4 MG PO TABS: 4.0000 mg | ORAL_TABLET | Freq: Three times a day (TID) | ORAL | 0 refills | Status: AC | PRN

## 2021-09-15 NOTE — Progress Notes (Signed)
Orthopaedic Postop Note  Assessment: Tamara Michael is a 75 y.o. female s/p removal of hardware from right olecranon  DOS: 09/07/21  Plan: Splint and suction dressing removed, sutures were trimmed.  Steri strips were placed.  Advised her to limit lifting to nothing more than a gallon of milk for another 2-3 weeks.  Ok to advance ROM.  Continue with antibiotics and complete prescription.  New prescription for zofran provided.   Meds ordered this encounter  Medications   ondansetron (ZOFRAN) 4 MG tablet    Sig: Take 1 tablet (4 mg total) by mouth every 8 (eight) hours as needed for up to 14 days for nausea or vomiting.    Dispense:  15 tablet    Refill:  0     Follow-up: Return in about 3 weeks (around 10/06/2021). XR at next visit: None  Subjective:  Chief Complaint  Patient presents with   Post-op Follow-up    Right elbow 09/07/21 states improving / bandage and vac removed, suture trimmed needs refill of Hydrocodone    History of Present Illness: Tamara Michael is a 75 y.o. female who presents following the above stated procedure.  Plate and screws removed without issue.  She has done well since surgery.  Pain is controlled.  She had no issues with the wound vac.    Review of Systems: No fevers or chills No numbness or tingling No Chest Pain No shortness of breath   Objective: There were no vitals taken for this visit.  Physical Exam:  Alert and oriented, no acute distress  Surgical incision is healing well.  No surrounding erythema or drainage.  Near full range of motion of the elbow.  Slightly restricted pronation and supination of the right forearm.  Sensation intact to the right hand.  Active motion intact in the AIN/PIN/U nerve distribution.   IMAGING: I personally ordered and reviewed the following images:  XR of the right elbow was obtained in clinic today and demonstrates positioning of the prior hardware.  No retained screws or other hardware.   Prior drill holes identified.  No adverse features.   Impression: right elbow XR following removal of hardware without acute injury  [name], MD 09/15/2021 10:58 PM

## 2021-09-15 NOTE — Patient Instructions (Signed)
No lifting more than a jug of milk  Zofran for nausea  Finish antibiotics

## 2021-10-06 ENCOUNTER — Encounter: Payer: Self-pay | Admitting: Orthopedic Surgery

## 2021-10-06 ENCOUNTER — Ambulatory Visit (INDEPENDENT_AMBULATORY_CARE_PROVIDER_SITE_OTHER): Payer: Medicare HMO | Admitting: Orthopedic Surgery

## 2021-10-06 ENCOUNTER — Other Ambulatory Visit: Payer: Self-pay

## 2021-10-06 VITALS — Ht 64.0 in | Wt 107.0 lb

## 2021-10-06 DIAGNOSIS — Z9889 Other specified postprocedural states: Secondary | ICD-10-CM

## 2021-10-06 NOTE — Progress Notes (Signed)
Orthopaedic Postop Note  Assessment: Tamara Michael is a 75 y.o. female s/p removal of hardware from right olecranon  DOS: 09/07/21  Plan: Incision is healing well. She has full range of motion.  She denies pain.  She is pleased with her results.  No follow up is needed.  She will call if she has any issues.    Follow-up: Return if symptoms worsen or fail to improve. XR at next visit: None  Subjective:  Chief Complaint  Patient presents with   Routine Post Op    Rt elbow DOS 09/07/21    History of Present Illness: Tamara Michael is a 75 y.o. female who presents following the above stated procedure.  Surgery was one month ago.  No issues with her incision.  She is not having any pain.     Review of Systems: No fevers or chills No numbness or tingling No Chest Pain No shortness of breath   Objective: Ht 5\' 4"  (1.626 m)   Wt 107 lb (48.5 kg)   BMI 18.37 kg/m   Physical Exam:  Alert and oriented, no acute distress  Surgical incision is healing well.  There is no surrounding erythema or drainage.  ROM from 10-130.  Slight limitation in pronation and supination.  No pain with motion.  No tenderness along the olecranon and the incision. 2+ radial pulse.  Sensation is intact throughout her right hand.   IMAGING: I personally ordered and reviewed the following images:  No new imaging obtained today.   , MD 10/06/2021 11:09 PM

## 2021-11-24 DIAGNOSIS — E559 Vitamin D deficiency, unspecified: Secondary | ICD-10-CM | POA: Diagnosis not present

## 2021-11-24 DIAGNOSIS — E785 Hyperlipidemia, unspecified: Secondary | ICD-10-CM | POA: Diagnosis not present

## 2021-11-24 DIAGNOSIS — Z79899 Other long term (current) drug therapy: Secondary | ICD-10-CM | POA: Diagnosis not present

## 2021-11-29 DIAGNOSIS — E559 Vitamin D deficiency, unspecified: Secondary | ICD-10-CM | POA: Diagnosis not present

## 2021-11-29 DIAGNOSIS — J449 Chronic obstructive pulmonary disease, unspecified: Secondary | ICD-10-CM | POA: Diagnosis not present

## 2021-11-29 DIAGNOSIS — Z23 Encounter for immunization: Secondary | ICD-10-CM | POA: Diagnosis not present

## 2021-11-29 DIAGNOSIS — E785 Hyperlipidemia, unspecified: Secondary | ICD-10-CM | POA: Diagnosis not present
